# Patient Record
Sex: Male | Born: 1990 | Hispanic: No | Marital: Single | Smoking: Current every day smoker
Health system: Southern US, Community
[De-identification: ages and names within clinical notes are randomized; demographics above are authoritative.]

## PROBLEM LIST (undated history)

## (undated) DIAGNOSIS — F32A Depression, unspecified: Secondary | ICD-10-CM

## (undated) DIAGNOSIS — F329 Major depressive disorder, single episode, unspecified: Secondary | ICD-10-CM

## (undated) DIAGNOSIS — F319 Bipolar disorder, unspecified: Secondary | ICD-10-CM

## (undated) HISTORY — PX: HX APPENDECTOMY: SHX54

## (undated) HISTORY — PX: APPENDECTOMY: SHX54

---
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
## 2013-10-06 ENCOUNTER — Encounter (HOSPITAL_BASED_OUTPATIENT_CLINIC_OR_DEPARTMENT_OTHER): Payer: Self-pay

## 2013-10-06 ENCOUNTER — Emergency Department (HOSPITAL_BASED_OUTPATIENT_CLINIC_OR_DEPARTMENT_OTHER)
Admission: EM | Admit: 2013-10-06 | Discharge: 2013-10-06 | Disposition: A | Discharge status: Home / Self Care | Payer: MEDICAID | Attending: Emergency Medicine | Admitting: Emergency Medicine

## 2013-10-06 ENCOUNTER — Emergency Department (HOSPITAL_BASED_OUTPATIENT_CLINIC_OR_DEPARTMENT_OTHER): Payer: MEDICAID

## 2013-10-06 DIAGNOSIS — K529 Noninfective gastroenteritis and colitis, unspecified: Secondary | ICD-10-CM | POA: Insufficient documentation

## 2013-10-06 DIAGNOSIS — F17200 Nicotine dependence, unspecified, uncomplicated: Secondary | ICD-10-CM | POA: Insufficient documentation

## 2013-10-06 DIAGNOSIS — R10814 Left lower quadrant abdominal tenderness: Secondary | ICD-10-CM | POA: Insufficient documentation

## 2013-10-06 DIAGNOSIS — R1110 Vomiting, unspecified: Secondary | ICD-10-CM | POA: Insufficient documentation

## 2013-10-06 DIAGNOSIS — K560 Paralytic ileus: Secondary | ICD-10-CM | POA: Insufficient documentation

## 2013-10-06 DIAGNOSIS — K5900 Constipation, unspecified: Secondary | ICD-10-CM | POA: Insufficient documentation

## 2013-10-06 DIAGNOSIS — R197 Diarrhea, unspecified: Secondary | ICD-10-CM | POA: Insufficient documentation

## 2013-10-06 LAB — COMPREHENSIVE METABOLIC PROFILE - BMC/JMC ONLY
ALBUMIN: 4.6 g/dL (ref 3.2–5.0)
ALKALINE PHOSPHATASE: 54 IU/L (ref 35–120)
ALT (SGPT): 30 IU/L (ref 0–63)
AST (SGOT): 39 IU/L (ref 0–45)
BILIRUBIN, TOTAL: 2.3 mg/dL — ABNORMAL HIGH (ref 0.0–1.3)
BUN: 14 mg/dL (ref 6–22)
CALCIUM: 9.9 mg/dL (ref 8.5–10.5)
CARBON DIOXIDE: 25 mmol/L (ref 22–32)
CHLORIDE: 98 mmol/L — ABNORMAL LOW (ref 101–111)
CREATININE: 0.95 mg/dL (ref 0.72–1.30)
ESTIMATED GLOMERULAR FILTRATION RATE: >60 mL/min (ref >60)
GLUCOSE: 110 mg/dL (ref 70–110)
POTASSIUM: 4 mmol/L (ref 3.5–5.0)
SODIUM: 132 mmol/L — ABNORMAL LOW (ref 136–145)
TOTAL PROTEIN: 7.2 g/dL (ref 6.0–8.0)

## 2013-10-06 LAB — CBC
BASOPHIL #: 0.02 K/uL (ref 0.00–0.10)
BASOPHILS %: 0.3 % (ref 0.0–1.4)
EOSINOPHIL #: 0.05 K/uL (ref 0.00–0.50)
EOSINOPHIL %: 0.7 % (ref 0.0–5.2)
HCT: 46.2 % (ref 39.0–50.0)
HGB: 16.7 g/dL (ref 13.5–18.0)
LYMPHOCYTE #: 0.96 10*3/uL (ref 0.70–3.20)
LYMPHOCYTE %: 12.9 % — ABNORMAL LOW (ref 15.0–43.0)
MCH: 32.3 pg (ref 28.0–34.0)
MCHC: 36.2 g/dL (ref 33.0–37.0)
MCV: 89.2 fL (ref 83.0–97.0)
MONOCYTE #: 0.42 10*3/uL (ref 0.20–0.90)
MONOCYTE %: 5.6 % (ref 4.8–12.0)
MPV: 8.3 fL (ref 7.0–9.4)
PLATELET COUNT: 189 10*3/uL (ref 150–400)
PMN #: 5.97 K/uL (ref 1.50–6.50)
PMN %: 80.6 % — ABNORMAL HIGH (ref 43.0–76.0)
RBC: 5.18 M/uL (ref 4.30–5.40)
RDW: 11.4 % (ref 11.0–13.0)
WBC: 7.4 K/uL (ref 4.0–11.0)

## 2013-10-06 LAB — LIPASE: LIPASE: 21 U/L (ref 0–60)

## 2013-10-06 MED ORDER — SODIUM CHLORIDE 0.9 % IV BOLUS
1000.0000 mL | INJECTION | Status: AC
Start: 2013-10-06 — End: 2013-10-06

## 2013-10-06 MED ORDER — KETOROLAC 30 MG/ML (1 ML) INJECTION SOLUTION
30.00 mg | INTRAMUSCULAR | Status: AC
Start: 2013-10-06 — End: 2013-10-06
  Administered 2013-10-06: 30 mg via INTRAVENOUS
  Filled 2013-10-06: qty 1

## 2013-10-06 MED ORDER — MAGNESIUM CITRATE ORAL SOLUTION
296.0000 mL | Freq: Once | ORAL | Status: AC
Start: 2013-10-06 — End: 2013-10-06
  Administered 2013-10-06: 296 mL via ORAL
  Filled 2013-10-06: qty 300

## 2013-10-06 MED ADMIN — sodium chloride 0.9 % intravenous solution: 0 mL | INTRAVENOUS

## 2013-10-06 MED ADMIN — sodium chloride 0.9 % intravenous solution: 1000 mL | INTRAVENOUS | NDC 00338004904

## 2013-10-06 NOTE — Discharge Instructions (Signed)
1. Miralax daily until resolved

## 2013-10-06 NOTE — ED Provider Notes (Signed)
Kara Pacer, MD  [facility]  Emergency Department Visit Note    Date:  10/06/2013  Primary care provider:  No primary provider on file.  Means of arrival:  private car  History obtained from: patient  History limited by: none    Chief Complaint:  Abdominal Pain    HISTORY OF PRESENT ILLNESS     Marco Nunez, date of birth [birth year], is a 22 y.o. male who presents to the Emergency Department complaining of abdominal pain.     Context:  Patient states that he thinks he is constipated. He reports that he has had some diarrhea.   Pertinent Past Medical History:  Patient has a history of appendectomy.   Onset:  Patient has had abdominal pain for the past week and a half.   Timing:  Constant  Location/Radiation:  Abdomen  Quality:  Unknown  Severity:  4/10  Modifying Factors:  Unknown  Associated Symptoms:   Positive:  Vomiting, constipation, and diarrhea  Negative:  Fevers, cough, sore throat, rhinorrhea, chills, sweats, rashes, hematochezia, urinary symptoms, or shortness of breath    REVIEW OF SYSTEMS     The pertinent positive and negative symptoms are as per HPI. All other systems reviewed and are negative.     PATIENT HISTORY     Past Medical History:  History reviewed. No pertinent past medical history.    Past Surgical History:  Past Surgical History   Procedure Laterality Date    Hx appendectomy         Family History:  No history of acute family illness given at this time.       Social History:  History   Substance Use Topics    Smoking status: Current Every Day Smoker -- 0.25 packs/day    Smokeless tobacco: Not on file    Alcohol Use: Yes      Comment: occ     History   Drug Use No       Medications:  Previous Medications    No medications on file       Allergies:  No Known Allergies    PHYSICAL EXAM     Vitals:  Filed Vitals:    10/06/13 0135   BP: 110/73   Pulse: 91   Temp: 37 C (98.6 F)   Resp: 16   SpO2: 100%     Pulse ox  100% on None (Room Air) interpreted by me as:  Normal    Constitutional: The patient is alert and oriented to person, place, and time. Well-developed and well-nourished.  HENT: Atraumatic, normocephalic head. Mucous membranes moist. TM's clear, Nares unremarkable. Oropharynx shows no erythema or exudate.   Eyes: Pupils equal and round, reactive to light. No scleral icterus. Normal conjunctiva. Extraocular movements are intact.  Neck: Supple, non-tender, no nuchal rigidity, no adenopathy.   Lungs: Clear to auscultation bilaterally. Symmetric and equal expansion. No respiratory distress or retractions.  Cardiovascular: Heart is S1-S2 regular rate and regular rhythm without murmur click or rub.  Abdomen:  Soft, non-distended. Diffuse abdominal tenderness, worse in left lower quadrant without evidence of rebound or guarding. No pulsatile masses. No organomegaly.   Genitourinary: No CVA tenderness.   Extremities: Full range of motion, no clubbing, cyanosis, or edema. Pulses 2+, capillary refill <2 seconds.  Spine: No midline or paraspinal muscle tenderness to palpation. No step-off.   Skin: Warm and dry. No cyanosis, jaundice, rash or lesion.  Neurologic: Alert and oriented x3. Normal facial symmetry  and speech, Normal upper and lower extremity strength, and grossly normal sensation.     DIAGNOSTIC STUDIES     Labs:    Results for orders placed during the hospital encounter of 10/06/13   CBC       Result Value Range    WBC 7.4  4.0 - 11.0 K/uL    RBC 5.18  4.30 - 5.40 M/uL    HGB 16.7  13.5 - 18.0 g/dL    HCT 16.1  09.6 - 04.5 %    MCV 89.2  83.0 - 97.0 fL    MCH 32.3  28.0 - 34.0 pg    MCHC 36.2  33.0 - 37.0 g/dL    RDW 40.9  81.1 - 91.4 %    PLATELET COUNT 189  150 - 400 K/uL    MPV 8.3  7.0 - 9.4 fL    PMN % 80.6 (*) 43.0 - 76.0 %    LYMPHOCYTE % 12.9 (*) 15.0 - 43.0 %    MONOCYTE % 5.6  4.8 - 12.0 %    EOSINOPHIL % 0.7  0.0 - 5.2 %    BASOPHILS % 0.3  0.0 - 1.4 %    PMN # 5.97  1.50 - 6.50 K/uL    LYMPHOCYTE # 0.96  0.70 - 3.20 K/uL    MONOCYTE # 0.42  0.20 -  0.90 K/uL    EOSINOPHIL # 0.05  0.00 - 0.50 K/uL    BASOPHIL # 0.02  0.00 - 0.10 K/uL   COMPREHENSIVE METABOLIC PROFILE - [facility] ONLY       Result Value Range    GLUCOSE 110  70 - 110 mg/dL    BUN 14  6 - 22 mg/dL    CREATININE 7.82  9.56 - 1.30 mg/dL    ESTIMATED GLOMERULAR FILTRATION RATE >60  >60 ml/min    SODIUM 132 (*) 136 - 145 mmol/L    POTASSIUM 4.0  3.5 - 5.0 mmol/L    CHLORIDE 98 (*) 101 - 111 mmol/L    CARBON DIOXIDE 25  22 - 32 mmol/L    CALCIUM 9.9  8.5 - 10.5 mg/dL    TOTAL PROTEIN 7.2  6.0 - 8.0 g/dL    ALBUMIN 4.6  3.2 - 5.0 g/dL    BILIRUBIN, TOTAL 2.3 (*) 0.0 - 1.3 mg/dL    AST (SGOT) 39  0 - 45 IU/L    ALT (SGPT) 30  0 - 63 IU/L    ALKALINE PHOSPHATASE 54  35 - 120 IU/L   LIPASE       Result Value Range    LIPASE 21  0 - 60 U/L     Labs reviewed and interpreted by me.    Radiology:    CT ABDOMEN PELVIS WO IV CONTRAST: Ileus likely secondary to gastroenteritis.   Radiological imaging interpreted by radiologist and independently reviewed by me.     ED PROGRESS NOTE / MEDICAL DECISION MAKING     Old records reviewed by me:  I have reviewed the nurse's notes. I have reviewed the patient's problem list. I have reviewed the patient's relevant previous records.      Orders Placed This Encounter    CT ABDOMEN PELVIS WO IV CONTRAST    CBC    COMPREHENSIVE METABOLIC PROFILE - [facility] ONLY    LIPASE    INSERT & MAINTAIN PERIPHERAL IV ACCESS    NS bolus infusion 1,000 mL    ketorolac (TORADOL) 30 mg/mL injection  magnesium citrate (CITROMA) oral liquid     Patient was initially treated with IV Toradol.  Abdomen/Pelvis CT and labs ordered.    Differentials:   Diverticulitis  Constipation  Bowel obstruction  Colic    0158: Initial evaluation is complete at this time. I discussed with the patient that I would give pain medication, scan him, check labs, and then reevaluate. Patient is agreeable with the treatment plan at this time.    0320: Patient treated with Citroma PO at this time.     0330: On  recheck, the patient is doing well. I explained the results of the diagnostic studies. I discussed the diagnosis, disposition, and follow-up plan. The patient understood and is in accordance with the treatment plan at this time. Return precautions to the Emergency Department were discussed. All of his questions have been answered to his satisfaction. The patient is in stable condition at the time of discharge.     Pre-Disposition Vitals:  Filed Vitals:    10/06/13 0135   BP: 110/73   Pulse: 91   Temp: 37 C (98.6 F)   Resp: 16   SpO2: 100%     CLINICAL IMPRESSION     Ileus  Gastroenteritis    DISPOSITION/PLAN     Discharged        Follow-Up:     Arliss Journey, MD  [address]  [address]  [place] [address]  [phone]    Call in 2 days    Condition at Disposition: Stable      SCRIBE ATTESTATION STATEMENT  I Corey Harold, SCRIBE scribed for Kara Pacer, MD on 10/06/2013 at 1:53 AM.     Documentation assistance provided for Kara Pacer, MD  by Corey Harold, SCRIBE. Information recorded by the scribe was done at my direction and has been reviewed and validated by me Kara Pacer, MD.

## 2013-10-06 NOTE — ED Nurses Note (Signed)
Patient discharged home with family.  AVS reviewed with patient/care giver.  A written copy of the AVS and discharge instructions was given to the patient/care giver.  Questions sufficiently answered as needed.  Patient/care giver encouraged to follow up with PCP as indicated.  In the event of an emergency, patient/care giver instructed to call 911 or go to the nearest emergency room.     There are no discharge medications for this patient.

## 2013-10-06 NOTE — ED Nurses Note (Signed)
Mid-epigastric abd pain x 1 1/2 weeks. "I thought I was constipated, but tonight at 1800 it got worse." Small amount of liquid stool. +vomiting this evening.

## 2014-09-04 ENCOUNTER — Encounter (HOSPITAL_BASED_OUTPATIENT_CLINIC_OR_DEPARTMENT_OTHER): Payer: Self-pay

## 2014-09-04 ENCOUNTER — Emergency Department (HOSPITAL_BASED_OUTPATIENT_CLINIC_OR_DEPARTMENT_OTHER): Payer: Worker's Comp, Other unspecified

## 2014-09-04 ENCOUNTER — Emergency Department (HOSPITAL_BASED_OUTPATIENT_CLINIC_OR_DEPARTMENT_OTHER)
Admission: EM | Admit: 2014-09-04 | Discharge: 2014-09-04 | Disposition: A | Discharge status: Home / Self Care | Payer: Worker's Comp, Other unspecified | Attending: Emergency Medicine | Admitting: Emergency Medicine

## 2014-09-04 DIAGNOSIS — Y990 Civilian activity done for income or pay: Secondary | ICD-10-CM | POA: Insufficient documentation

## 2014-09-04 DIAGNOSIS — S161XXA Strain of muscle, fascia and tendon at neck level, initial encounter: Secondary | ICD-10-CM | POA: Insufficient documentation

## 2014-09-04 DIAGNOSIS — S40011A Contusion of right shoulder, initial encounter: Secondary | ICD-10-CM | POA: Insufficient documentation

## 2014-09-04 DIAGNOSIS — S29012A Strain of muscle and tendon of back wall of thorax, initial encounter: Secondary | ICD-10-CM | POA: Insufficient documentation

## 2014-09-04 DIAGNOSIS — Y9289 Other specified places as the place of occurrence of the external cause: Secondary | ICD-10-CM | POA: Insufficient documentation

## 2014-09-04 DIAGNOSIS — F17210 Nicotine dependence, cigarettes, uncomplicated: Secondary | ICD-10-CM | POA: Insufficient documentation

## 2014-09-04 DIAGNOSIS — W228XXA Striking against or struck by other objects, initial encounter: Secondary | ICD-10-CM | POA: Insufficient documentation

## 2014-09-04 MED ORDER — NAPROXEN 375 MG TABLET
375.00 mg | ORAL_TABLET | Freq: Two times a day (BID) | ORAL | Status: AC
Start: 2014-09-04 — End: ?

## 2014-09-04 NOTE — ED Provider Notes (Signed)
Marco Nunez Marco Nunez, P.A.  [facility]  Emergency Department Visit Note    Date: 09/04/2014  Primary care provider: None Given  Means of arrival: private car  History obtained by: patient  History limited by: none  ED Attending: Dr Vonda AntiguaGentle      Chief Complaint: injury to neck & back while @ work    History of Present Illness     Marco Nunez, date of birth 1991/07/27, is a 23 y.o. male who presents to the Emergency Department complaining of trauma to neck & back while @ work today.   The patient presents here via private automobile after a work-related injury.  The patient states he was in his place of employment and unloading a tractor trailer when a stack of multiple boxes fell on him.  The patient states he was leaning over at the time and he estimates somewhere between 12 and 15 boxes fell, striking him over the upper back and neck.  He estimates the weight of the individual boxes as being between 2 and 3 pounds.  He suffered no loss of consciousness.  The patient states he continued to work, but experienced some stiffness along the right side of his neck, as well as along the upper shoulder blade on the left.  The patient states he did not try any medications for this, but because of the persistent nature of his symptoms, requested to leave work for evaluation.  As already stated, the patient drove himself here.    REVIEW OF SYSTEMS:  No loss of consciousness, no visual changes, no ear drainage, no nasal discharge, no difficulty swallowing or breathing.  No chest pain, no shortness of breath, no extremity weakness.  All other remaining review of systems have been discussed and are negative.        Context:  As Per HPI  Pertinent Past Medical History:  Reviewed  Onset:  As per HPI  Timing:  As per HPI  Location/Radiation:neck    Quality:  As per HPI  Severity:  5  Modifying Factors:  As per HPI  Associated Symptoms:   Positive:  As per HPI  Negative:  As per HPI    Review of Systems     The pertinent positive  and negative symptoms are as per HPI. All other systems reviewed and are negative.    Patient History      Past Medical History:  History reviewed. No pertinent past medical history.        Past Surgical History:  Past Surgical History   Procedure Laterality Date   . Hx appendectomy             Family History:  No family history on file.        Social History:  History   Substance Use Topics   . Smoking status: Current Every Day Smoker -- 0.25 packs/day   . Smokeless tobacco: Not on file   . Alcohol Use: Yes      Comment: occ     History   Drug Use No       Medications:  Previous Medications    No medications on file       Allergies:   No Known Allergies    Physical Exam   PHYSICAL EXAMINATION:  This is a well-nourished, well-developed young male patient.  He is sitting upright on the stretcher using his cell phone with no signs of acute distress.    NEUROLOGICAL:  Speech is clear and coherent.  Cranial nerves II-VII are grossly intact.  Deep tendon reflexes are symmetrical in the upper and lower extremities.  He can do finger-to-nose and past pointing without difficulty.  He can do rapid alternating movements without difficulty.  He can abduct and flex the shoulders to 90 degrees against resistance with no focal weakness.  Grips were symmetrical, 5/5.  He can perform tandem walk, including walking on his heels and toes with no deficits.  Romberg was negative.  He had no focal motor or sensory deficits on his neuro exam.    HEENT:  His head is normocephalic and atraumatic.  Eyes:  Conjunctivae pink.  Sclerae anicteric.  Pupils are round, equal, react to light.  Extraocular movements are intact.  There was no nystagmus.  Ears:  No hemotympanum or Battle sign.  Nares:  No epistaxis.  Oral cavity:  Dentition was in good condition.  Tongue is on midline.  Posterior pharynx without injection or asymmetry.    NECK:  Complains of tenderness paraspinously on the right side of his neck.  No crepitus, or step-off over the  cervical spine and no tenderness over the spinous processes from the thoracic spine to the sacrum.  Posteriorly, there was no evidence of any asymmetry or respiratory excursion.    HEART:  Regular rate and rhythm without tachycardia or murmur.  Peripheral pulses were symmetrical and +2 throughout.    LUNGS:  Clear to auscultation.    ABDOMEN:  Flat, nontender.    EXTREMITIES:  No dependent edema, no focal weakness.    INTEGUMENTARY SYSTEM:  No evidence of any ecchymosis, erythema (no open skin lesions).    Because of the patient's concerns regarding a work-related injury, a plain cervical spine series has been ordered.        Vital Signs:  Filed Vitals:    09/04/14 0859   BP: 120/62   Pulse: 94   Temp: 36.3 C (97.4 F)   Resp: 16   SpO2: 100%              Diagnostics     Labs:  No results found for this or any previous visit (from the past 12 hour(s)).  Labs reviewed and interpreted by me.    Radiology: The patient's X RAYS were negative.     The patient is being prescribed anti-inflammatory and muscle relaxer.  The patient is being excused from work for 3 days.  The patient is being referred to the medical doctor on-call for outpatient followup, Dr. Laurell Kane, or the patient can followup with urgent care.  The patient was given a hand-out on muscle on contusions and strains.  The patient's condition upon discharge is considered unchanged, but good.        XR CERVICAL SPINE SERIES (4 OR MORE VIEWS)  Interpreted by radiologist and independently reviewed by me.    Warnell Bureau on Accession: [id number] MRN: [id number] OrderingMD: Kalani Baray J      >>>>>> Prelim: No acute fracture.      No_Acute_Findings     >>>>>> States:  By vjenning @10 /30/2015 9:55:29 AM: Needs Prelim   By hjung @10 /30/2015 10:06:08 AM: Prelim from Rad - Negative   By dwert @10 /30/2015 10:10:05 AM: Acknowledged by ED     >>>>>> Notes:  By vjenning @10 /30/2015 9:55:38 AM: work related injury- multiple boxes fell on neck, right neck pain      >>>>>> Final Communication:            Old records reviewed by me:  Orders Placed This Encounter   . XR CERVICAL SPINE SERIES (4 OR MORE VIEWS)   . naproxen (NAPROSYN) 375 mg Oral Tablet        Pre-hypertension/ Hypertension: The patient has been informed that they may have pre-hypertension or Hypertension based on a blood pressure reading in the emergency department.  I recommend that the patient call the primary care provider listed on their discharge instructions or a physician of their choice this week to arrange follow up for further evaluation of possible pre-hypertension or Hypertension.    Pre-Disposition Vitals:  Filed Vitals:    09/04/14 0859   BP: 120/62   Pulse: 94   Temp: 36.3 C (97.4 F)   Resp: 16   SpO2: 100%       Clinical Impression      1.Acute Contusion to Neck;Right Trapezius   2.Acute Left Rhomboid Strain    Plan/Disposition     Discharged    Prescriptions:  New Prescriptions    NAPROXEN (NAPROSYN) 375 MG ORAL TABLET    Take 1 Tab (375 mg total) by mouth Twice daily with food       Follow Up:  Burna CashBurke, Lola, MD  [address]  [place] [id number]  [phone]    Schedule an appointment as soon as possible for a visit in 2 days  As needed        Condition on Disposition: stable

## 2014-09-04 NOTE — ED Nurses Note (Signed)
States boxes fell on him this am at work, has neck and back pain.

## 2016-01-12 ENCOUNTER — Emergency Department
Admission: EM | Admit: 2016-01-12 | Discharge: 2016-01-12 | Disposition: A | Discharge status: Home / Self Care | Payer: Self-pay | Attending: Student | Admitting: Student

## 2016-01-12 DIAGNOSIS — Z23 Encounter for immunization: Secondary | ICD-10-CM | POA: Insufficient documentation

## 2016-01-12 DIAGNOSIS — Y9289 Other specified places as the place of occurrence of the external cause: Secondary | ICD-10-CM | POA: Insufficient documentation

## 2016-01-12 DIAGNOSIS — S61213A Laceration without foreign body of left middle finger without damage to nail, initial encounter: Secondary | ICD-10-CM | POA: Insufficient documentation

## 2016-01-12 DIAGNOSIS — F17210 Nicotine dependence, cigarettes, uncomplicated: Secondary | ICD-10-CM | POA: Insufficient documentation

## 2016-01-12 DIAGNOSIS — Y9389 Activity, other specified: Secondary | ICD-10-CM | POA: Insufficient documentation

## 2016-01-12 DIAGNOSIS — S61219A Laceration without foreign body of unspecified finger without damage to nail, initial encounter: Secondary | ICD-10-CM

## 2016-01-12 DIAGNOSIS — Y288XXA Contact with other sharp object, undetermined intent, initial encounter: Secondary | ICD-10-CM | POA: Insufficient documentation

## 2016-01-12 DIAGNOSIS — Y998 Other external cause status: Secondary | ICD-10-CM | POA: Insufficient documentation

## 2016-01-12 MED ORDER — TETANUS-DIPHTH-ACELL PERTUSSIS 5-2.5-18.5 LF-MCG/0.5 IM SUSP
0.5000 mL | Freq: Once | INTRAMUSCULAR | Status: AC
  Administered 2016-01-12: 0.5 mL via INTRAMUSCULAR
  Filled 2016-01-12: qty 0.5

## 2016-01-12 MED ORDER — SULFAMETHOXAZOLE-TRIMETHOPRIM 800-160 MG PO TABS: 1.0000 | ORAL_TABLET | Freq: Two times a day (BID) | ORAL | Status: DC

## 2016-01-12 NOTE — Discharge Instructions (Signed)
Wear splint take medication as directed.

## 2016-01-12 NOTE — ED Notes (Signed)
Pt states he cut his left middle finger on the seat lever in his car last night

## 2016-01-12 NOTE — ED Notes (Signed)
See triage   Small laceration noted to left 3 rd digit   Bleeding controlled  States this happened about 7 pm las t night

## 2016-01-12 NOTE — ED Provider Notes (Signed)
[facility] Emergency Department Provider Note  ____________________________________________  Time seen: Approximately 1:53 PM  I have reviewed the triage vital signs and the nursing notes.   HISTORY  Chief Complaint Laceration    HPI Brent Peterson is a 25 y.o. male laceration third digit left hand secondary to a metal cut. Incident occurred approximately 1900 hrs. last night. Patient said bleeding controlled with pressure. Patient last tetanus shot unknown. Patient denies any loss sensation or loss of function of the finger.Patient denies pain at this time.   History reviewed. No pertinent past medical history.  There are no active problems to display for this patient.   Past Surgical History  Procedure Laterality Date  . Appendectomy      Current Outpatient Rx  Name  Route  Sig  Dispense  Refill  . sulfamethoxazole-trimethoprim (BACTRIM DS,SEPTRA DS) 800-160 MG tablet   Oral   Take 1 tablet by mouth 2 (two) times daily.   20 tablet   0     Allergies Review of patient's allergies indicates no known allergies.  No family history on file.  Social History Social History  Substance Use Topics  . Smoking status: Current Every Day Smoker    Types: Cigarettes  . Smokeless tobacco: None  . Alcohol Use: Yes    Review of Systems Constitutional: No fever/chills Eyes: No visual changes. ENT: No sore throat. Cardiovascular: Denies chest pain. Respiratory: Denies shortness of breath. Gastrointestinal: No abdominal pain.  No nausea, no vomiting.  No diarrhea.  No constipation. Genitourinary: Negative for dysuria. Musculoskeletal: Negative for back pain. Skin: Negative for rash. Laceration third finger left hand. Neurological: Negative for headaches, focal weakness or numbness.    ____________________________________________   PHYSICAL EXAM:  VITAL SIGNS: ED Triage Vitals  Enc Vitals Group     BP 01/12/16 1330 111/68 mmHg   Pulse Rate 01/12/16 1330 77     Resp 01/12/16 1330 16     Temp 01/12/16 1330 97.8 F (36.6 C)     Temp Source 01/12/16 1330 Oral     SpO2 01/12/16 1330 100 %     Weight 01/12/16 1330 150 lb (68.04 kg)     Height 01/12/16 1330  (1.702 m)     Head Cir --      Peak Flow --      Pain Score --      Pain Loc --      Pain Edu? --      Excl. in GC? --     Constitutional: Alert and oriented. Well appearing and in no acute distress. Eyes: Conjunctivae are normal. PERRL. EOMI. Head: Atraumatic. Nose: No congestion/rhinnorhea. Mouth/Throat: Mucous membranes are moist.  Oropharynx non-erythematous. Neck: No stridor.  No cervical spine tenderness to palpation. Hematological/Lymphatic/Immunilogical: No cervical lymphadenopathy. Cardiovascular: Normal rate, regular rhythm. Grossly normal heart sounds.  Good peripheral circulation. Respiratory: Normal respiratory effort.  No retractions. Lungs CTAB. Gastrointestinal: Soft and nontender. No distention. No abdominal bruits. No CVA tenderness. Musculoskeletal: No lower extremity tenderness nor edema.  No joint effusions. Neurologic:  Normal speech and language. No gross focal neurologic deficits are appreciated. No gait instability. Skin:  Skin is warm, dry and intact. No rash noted. 0.5 cm palmar aspect of the proximal phalange third digit left hand. Psychiatric: Mood and affect are normal. Speech and behavior are normal.  ____________________________________________   LABS (all labs ordered are listed, but only abnormal results are displayed)  Labs Reviewed - No data to display ____________________________________________  EKG   ____________________________________________  RADIOLOGY   ____________________________________________   PROCEDURES  Procedure(s) performed: None  Critical Care performed: No  ____________________________________________   INITIAL IMPRESSION / ASSESSMENT AND PLAN / ED COURSE  Pertinent labs &  imaging results that were available during my care of the patient were reviewed by me and considered in my medical decision making (see chart for details). Laceration third digit left hand. Discussed rationale for not suturing this time secondary due to time between injury. Patient given a tetanus shot in the ED. She had prescription for Bactrim. Laceration with clean and sterile strips applied. Patient was placed in a finger splint. ____________________________________________   FINAL CLINICAL IMPRESSION(S) / ED DIAGNOSES  Final diagnoses:  Finger laceration, initial encounter      Joni ReiningRonald K Chronister, PA-C 01/12/16 1407  Gayla DossEryka A Gayle, MD 01/12/16 1547

## 2016-07-21 ENCOUNTER — Encounter: Payer: Self-pay | Admitting: Emergency Medicine

## 2016-07-21 ENCOUNTER — Emergency Department
Admission: EM | Admit: 2016-07-21 | Discharge: 2016-07-22 | Disposition: A | Discharge status: Other Acute Inpatient Hospital | Payer: Self-pay | Attending: Emergency Medicine | Admitting: Emergency Medicine

## 2016-07-21 DIAGNOSIS — F32A Depression, unspecified: Secondary | ICD-10-CM

## 2016-07-21 DIAGNOSIS — T1491XA Suicide attempt, initial encounter: Secondary | ICD-10-CM

## 2016-07-21 DIAGNOSIS — Z5181 Encounter for therapeutic drug level monitoring: Secondary | ICD-10-CM | POA: Insufficient documentation

## 2016-07-21 DIAGNOSIS — F17210 Nicotine dependence, cigarettes, uncomplicated: Secondary | ICD-10-CM | POA: Insufficient documentation

## 2016-07-21 DIAGNOSIS — F329 Major depressive disorder, single episode, unspecified: Secondary | ICD-10-CM | POA: Insufficient documentation

## 2016-07-21 LAB — URINE DRUG SCREEN, QUALITATIVE (ARMC ONLY)
Amphetamines, Ur Screen: NOT DETECTED
BARBITURATES, UR SCREEN: NOT DETECTED
Benzodiazepine, Ur Scrn: NOT DETECTED
COCAINE METABOLITE, UR ~~LOC~~: NOT DETECTED
Cannabinoid 50 Ng, Ur ~~LOC~~: NOT DETECTED
MDMA (ECSTASY) UR SCREEN: NOT DETECTED
Methadone Scn, Ur: NOT DETECTED
OPIATE, UR SCREEN: NOT DETECTED
Phencyclidine (PCP) Ur S: NOT DETECTED
Tricyclic, Ur Screen: NOT DETECTED

## 2016-07-21 LAB — CBC
HEMATOCRIT: 47.1 % (ref 40.0–52.0)
HEMOGLOBIN: 16.6 g/dL (ref 13.0–18.0)
MCH: 31.1 pg (ref 26.0–34.0)
MCHC: 35.3 g/dL (ref 32.0–36.0)
MCV: 88 fL (ref 80.0–100.0)
Platelets: 198 10*3/uL (ref 150–440)
RBC: 5.35 MIL/uL (ref 4.40–5.90)
RDW: 13.2 % (ref 11.5–14.5)
WBC: 8.5 10*3/uL (ref 3.8–10.6)

## 2016-07-21 LAB — COMPREHENSIVE METABOLIC PANEL
ALBUMIN: 5.2 g/dL — AB (ref 3.5–5.0)
ALK PHOS: 46 U/L (ref 38–126)
ALT: 23 U/L (ref 17–63)
AST: 23 U/L (ref 15–41)
Anion gap: 9 (ref 5–15)
BUN: 12 mg/dL (ref 6–20)
CALCIUM: 9.8 mg/dL (ref 8.9–10.3)
CO2: 27 mmol/L (ref 22–32)
CREATININE: 0.96 mg/dL (ref 0.61–1.24)
Chloride: 104 mmol/L (ref 101–111)
GFR calc non Af Amer: >60 mL/min (ref >60)
GLUCOSE: 103 mg/dL — AB (ref 65–99)
Potassium: 4 mmol/L (ref 3.5–5.1)
SODIUM: 140 mmol/L (ref 135–145)
Total Bilirubin: 2.5 mg/dL — ABNORMAL HIGH (ref 0.3–1.2)
Total Protein: 8.6 g/dL — ABNORMAL HIGH (ref 6.5–8.1)

## 2016-07-21 LAB — ETHANOL: Alcohol, Ethyl (B): <5 mg/dL (ref <5)

## 2016-07-21 LAB — ACETAMINOPHEN LEVEL: Acetaminophen (Tylenol), Serum: <10 ug/mL — ABNORMAL LOW (ref 10–30)

## 2016-07-21 LAB — SALICYLATE LEVEL: Salicylate Lvl: <4 mg/dL (ref 2.8–30.0)

## 2016-07-21 MED ORDER — BUPROPION HCL 75 MG PO TABS
75.0000 mg | ORAL_TABLET | Freq: Every day | ORAL | Status: DC
  Administered 2016-07-22: 75 mg via ORAL
  Filled 2016-07-21: qty 1

## 2016-07-21 MED ORDER — HALOPERIDOL LACTATE 5 MG/ML IJ SOLN
INTRAMUSCULAR | Status: AC
  Filled 2016-07-21: qty 1

## 2016-07-21 MED ORDER — LORAZEPAM 2 MG/ML IJ SOLN
INTRAMUSCULAR | Status: AC
  Filled 2016-07-21: qty 1

## 2016-07-21 NOTE — ED Notes (Signed)
Pt brought into ED BHU via sally port and wand with metal detector for safety by [employer] officer. Patient oriented to unit/care area: Pt informed of unit policies and procedures.  Informed that, for their safety, care areas are designed for safety and monitored by security cameras at all times; and visiting hours explained to patient. Patient verbalizes understanding, and verbal contract for safety obtained.Pt shown to their room.   BEHAVIORAL HEALTH ROUNDING  Patient sleeping: No.  Patient alert and oriented: yes  Behavior appropriate: Yes. ; If no, describe:  Nutrition and fluids offered: Yes  Toileting and hygiene offered: Yes  Sitter present: not applicable, Q 15 min safety rounds and observation via security camera. Law enforcement present: Yes [employer]   ENVIRONMENTAL ASSESSMENT  Potentially harmful objects out of patient reach: Yes.  Personal belongings secured: Yes.  Patient dressed in hospital provided attire only: Yes.  Plastic bags out of patient reach: Yes.  Patient care equipment (cords, cables, call bells, lines, and drains) shortened, removed, or accounted for: Yes.  Equipment and supplies removed from bottom of stretcher: Yes.  Potentially toxic materials out of patient reach: Yes.  Sharps container removed or out of patient reach: Yes.    
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
## 2016-07-22 ENCOUNTER — Encounter: Payer: Self-pay | Admitting: *Deleted

## 2016-07-22 ENCOUNTER — Inpatient Hospital Stay
Admission: RE | Admit: 2016-07-22 | Discharge: 2016-07-26 | DRG: 885 | Disposition: A | Discharge status: Home / Self Care | Payer: No Typology Code available for payment source | Source: Intra-hospital | Attending: Psychiatry | Admitting: Psychiatry

## 2016-07-22 DIAGNOSIS — F322 Major depressive disorder, single episode, severe without psychotic features: Principal | ICD-10-CM | POA: Diagnosis present

## 2016-07-22 DIAGNOSIS — G4700 Insomnia, unspecified: Secondary | ICD-10-CM | POA: Diagnosis present

## 2016-07-22 DIAGNOSIS — R45851 Suicidal ideations: Secondary | ICD-10-CM | POA: Diagnosis present

## 2016-07-22 DIAGNOSIS — Z9049 Acquired absence of other specified parts of digestive tract: Secondary | ICD-10-CM | POA: Diagnosis not present

## 2016-07-22 DIAGNOSIS — F17210 Nicotine dependence, cigarettes, uncomplicated: Secondary | ICD-10-CM | POA: Diagnosis present

## 2016-07-22 DIAGNOSIS — T1491XA Suicide attempt, initial encounter: Secondary | ICD-10-CM

## 2016-07-22 MED ORDER — TRAZODONE HCL 100 MG PO TABS: 100.0000 mg | ORAL_TABLET | Freq: Every evening | ORAL | Status: DC | PRN

## 2016-07-22 MED ORDER — MAGNESIUM HYDROXIDE 400 MG/5ML PO SUSP: 30.0000 mL | Freq: Every day | ORAL | Status: DC | PRN

## 2016-07-22 MED ORDER — CITALOPRAM HYDROBROMIDE 20 MG PO TABS: 20.0000 mg | ORAL_TABLET | Freq: Every day | ORAL | Status: DC

## 2016-07-22 MED ORDER — ALUM & MAG HYDROXIDE-SIMETH 200-200-20 MG/5ML PO SUSP: 30.0000 mL | ORAL | Status: DC | PRN

## 2016-07-22 MED ORDER — CITALOPRAM HYDROBROMIDE 20 MG PO TABS
20.0000 mg | ORAL_TABLET | Freq: Every day | ORAL | Status: DC
  Administered 2016-07-23 – 2016-07-26 (×4): 20 mg via ORAL
  Filled 2016-07-22 (×4): qty 1

## 2016-07-22 MED ORDER — ACETAMINOPHEN 325 MG PO TABS: 650.0000 mg | ORAL_TABLET | Freq: Four times a day (QID) | ORAL | Status: DC | PRN

## 2016-07-22 NOTE — Tx Team (Signed)
Initial Treatment Plan 07/22/2016 6:49 PM Brent Peterson [id number]    PATIENT STRESSORS: Marital or family conflict   PATIENT STRENGTHS: Active sense of humor Average or above average intelligence Capable of independent living Motivation for treatment/growth   PATIENT IDENTIFIED PROBLEMS: "Learn effective coping skill"  "Make depression go away"                   DISCHARGE CRITERIA:  Improved stabilization in mood, thinking, and/or behavior  PRELIMINARY DISCHARGE PLAN: Return to previous living arrangement Return to previous work or school arrangements  PATIENT/FAMILY INVOLVEMENT: This treatment plan has been presented to and reviewed with the patient, Brent Peterson, and/or family member, .  The patient and family have been given the opportunity to ask questions and make suggestions.  [name], [name] B, RN 07/22/2016, 6:49 PM

## 2016-07-22 NOTE — ED Provider Notes (Signed)
-----------------------------------------   12:43 PM on 07/22/2016 -----------------------------------------  The patient has been seen and evaluated by psychiatry. The plan is to admit to the psychiatric service once a bed becomes available.   Minna AntisKevin Ajanay Farve, MD 07/22/16 1243

## 2016-07-22 NOTE — ED Provider Notes (Signed)
-----------------------------------------   6:30 AM on 07/22/2016 -----------------------------------------   Blood pressure 111/62, pulse 72, temperature 98 F (36.7 C), temperature source Oral, resp. rate 18, height 5' 7.5" (1.715 m), weight 147 lb (66.7 kg), SpO2 98 %.  The patient had no acute events since last update.  Calm and cooperative at this time.  Disposition is pending Psychiatry/Behavioral Medicine team recommendations.     Jennye MoccasinBrian S Quigley, MD 07/22/16 [phone]

## 2016-07-22 NOTE — ED Notes (Signed)
Was able to speak with gf and mood improved

## 2016-07-22 NOTE — ED Notes (Signed)
BEHAVIORAL HEALTH ROUNDING Patient sleeping: Yes.   Patient alert and oriented: not applicable SLEEPING Behavior appropriate: Yes.  ; If no, describe: SLEEPING Nutrition and fluids offered: No SLEEPING Toileting and hygiene offered: NoSLEEPING Sitter present: not applicable, Q 15 min safety rounds and observation via security camera. Law enforcement present: Yes ODS 

## 2016-07-22 NOTE — BH Assessment (Signed)
[facility] by Dr. Toni Amendlapacs.  Attending Physician will be Dr. Ardyth HarpsHernandez.   Patient [facility] room [facility] KenmorePhyllis.   Intake Paper Work has been signed and placed on patient chart.  ER staff is aware of the admission French Ana(Tracy, ER Sect.; Dr. Jeri LagerPaducowski, ER MD; Windell Mouldinguth, Patient's Nurse & Angelique Blonderenise, Patient Access).

## 2016-07-22 NOTE — ED Notes (Signed)
Pt taken by wc to beh med without problems

## 2016-07-22 NOTE — ED Notes (Signed)
Dr Clapacs here 
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
## 2016-07-23 DIAGNOSIS — F322 Major depressive disorder, single episode, severe without psychotic features: Principal | ICD-10-CM

## 2016-07-23 LAB — TSH: TSH: 1.673 u[IU]/mL (ref 0.350–4.500)

## 2016-07-23 NOTE — Plan of Care (Signed)
Problem: Coping: Goal: Ability to cope will improve Outcome: Progressing Pt reports that he has started reading the bible as a way to cope with depression.  Problem: Safety: Goal: Periods of time without injury will increase Outcome: Progressing Pt remains free from harm.
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
## 2016-07-23 NOTE — [facility] Group Notes (Signed)
[facility] LCSW Group Therapy  07/23/2016 2:29 PM  Type of Therapy:  Group Therapy  Participation Level:  Active  Participation Quality:  Appropriate and Attentive  Affect:  Appropriate  Cognitive:  Alert  Insight:  Engaged  Engagement in Therapy:  Engaged  Modes of Intervention:  Discussion, Education and Support  Summary of Progress/Problems:Self esteem: Patients discussed self esteem and how it impacts them. They discussed what aspects in their lives has influenced their self esteem. They were challenged to identify changes that are needed in order to improve self esteem. Patients participated in activity where they had to identify positive adjectives they felt described their personality. Patients shared with the group on the following areas: Things I am good at, What I like about my appearance, I've helped others by, What I value the most, compliments I have received, challenges I have overcome, thing that make me unique, and Times I've made others happy. Patient states he wants to reconnect with his faith and spirituality and is going to seek counsel from his church pastor as well as follow-up with aftercare plan.    Moyinoluwa Dawe G. Garnette CzechSampson MSW, LCSWA 07/23/2016, 2:30 PM
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
## 2016-07-23 NOTE — Plan of Care (Signed)
Problem: Self-Concept: Goal: Ability to disclose and discuss suicidal ideas will improve Outcome: Progressing Pt talked about suicide attempt prior to admission, and indicated that he did not feel suicidal anymore.
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
## 2016-07-23 NOTE — [facility] Suicide Risk Assessment (Signed)
[facility] INPATIENT:  Family/Significant Other Suicide Prevention Education  Suicide Prevention Education:  Patient Refusal for Family/Significant Other Suicide Prevention Education: The patient Brent PrudeDaymond R Katrinka BlazingSmith has refused to provide written consent for family/significant other to be provided Family/Significant Other Suicide Prevention Education during admission and/or prior to discharge.  Physician notified.  Jodelle Fausto G. Garnette CzechSampson MSW, LCSWA 07/23/2016, 10:55 AM

## 2016-07-23 NOTE — Progress Notes (Signed)
Pt has been pleasant and cooperative. Pt's mood and affect has been depressed. Pt denies SI and A/V hallucinations.Pt has been active on the unit. 
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
## 2016-07-24 NOTE — [facility] Group Notes (Signed)
[facility] Group Notes:  (Nursing/MHT/Case Management/Adjunct)  Date:  07/24/2016  Time:  4:56 PM  Type of Therapy:  Psychoeducational Skills  Participation Level:  Active  Participation Quality:  Appropriate and Intrusive  Affect:  Appropriate  Cognitive:  Appropriate and Oriented  Insight:  Appropriate  Engagement in Group:  Engaged  Modes of Intervention:  Discussion and Education  Summary of Progress/Problems:  [name] 07/24/2016, 4:56 PM

## 2016-07-24 NOTE — Progress Notes (Signed)
Patient with bright affect, cooperative behavior with meals, meds and plan of care. No SI/HI/AVH at this time. Verbalizes needs appropriately with staff. Completes daily audit sheet with goal of getting close with God. Therapy groups encouraged. Safety maintained.

## 2016-07-24 NOTE — Progress Notes (Signed)
D: Pt is pleasant and cooperative this evening. He is seen in the milieu interacting appropriately with staff and peers. Denies SI/HI/AVH at this time. Pt reports "I get sad when I think about my kid, but I've been reading the bible and it's really helped." A: Emotional support and encouragement provided. Medications administered with education. q15 minute safety checks maintained. R: Pt remains free from harm. Will continue to monitor.

## 2016-07-24 NOTE — [facility] Group Notes (Signed)
[facility] LCSW Group Therapy   07/24/2016 9:30am Type of Therapy: Group Therapy   Participation Level: Active   Participation Quality: Attentive, Sharing and Supportive   Affect: Appropriate   Cognitive: Alert and Oriented   Insight: Developing/Improving and Engaged   Engagement in Therapy: Developing/Improving and Engaged   Modes of Intervention: Clarification, Confrontation, Discussion, Education, Exploration,  Limit-setting, Orientation, Problem-solving, Rapport Building, Dance movement psychotherapisteality Testing, Socialization and Support   Summary of Progress/Problems: Pt identified obstacles faced currently and processed barriers involved in overcoming these obstacles. Pt identified steps necessary for overcoming these obstacles and explored motivation (internal and external) for facing these difficulties head on. Pt further identified one area of concern in their lives and chose a goal to focus on for today. Patient defined obstacle as a "test." He stated that his depressive symptoms become overbearing along with loneliness which are obstacles that he face daily. He identified reading the bible, prayer, and positive affirmations as tools to implement in his love to overcome obstacles.  Brent AbbotKadijah Digna Peterson, MSW, Brent MajorsLCSWA

## 2016-07-24 NOTE — [facility] Group Notes (Signed)
[facility] Group Notes:  (Nursing/MHT/Case Management/Adjunct)  Date:  07/24/2016  Time:  12:48 AM  Type of Therapy:  Psychoeducational Skills  Participation Level:  Active  Participation Quality:  Supportive  Affect:  Appropriate  Cognitive:  Alert  Insight:  Good  Engagement in Group:  Engaged  Modes of Intervention:  Support  Summary of Progress/Problems:  Brent NeerJackie L Mishel Sans 07/24/2016, 12:48 AM

## 2016-07-24 NOTE — Progress Notes (Signed)
Recreation Therapy Notes  Date: 09.18.17 Time: 1:00 pm Location: Craft Room  Group Topic: Self-expression  Goal Area(s) Addresses:  Patient will identify one color per emotion listed on wheel. Patient will verbalize benefit of using art as a means of self-expression. Patient will verbalize one emotion experienced during session. Patient will be educated on other forms of self-expression.  Behavioral Response: Attentive, Interactive  Intervention: Emotion Wheel  Activity: Patients were given an Arboriculturistmotion Wheel worksheet and instructed to pick a color for each emotion listed on the wheel.  Education: LRT educated patients on other forms of self-expression.  Education Outcome: Acknowledges education/In group clarification offered  Clinical Observations/Feedback: Patient completed activity by picking colors for each emotion. Patient contributed to group discussion by stating some colors he picked for certain emotions and why, that it was helpful to see his emotions on paper and why, and what makes art a good form of self-expression.  Jacquelynn CreeGreene,Bren Borys M, LRT/CTRS 07/24/2016 2:34 PM

## 2016-07-24 NOTE — Progress Notes (Signed)
Recreation Therapy Notes  INPATIENT RECREATION THERAPY ASSESSMENT  Patient Details Name: Brent EllisDaymond R Syring MRN: [id number] DOB: September 30, 1991 Today's Date: 07/24/2016  Patient Stressors: Relationship (Break-up with girlfriend, but they are back together now)  Coping Skills:   Exercise, Music, Sports, Other (Comment) (Praying, reading the Bible, writing)  Personal Challenges: Communication, Expressing Yourself, Time Management, Trusting Others  Leisure Interests (2+):  Games - Video games, Music - Listen  Awareness of Community Resources:  Yes  Community Resources:  Research scientist (physical sciences)Movie Theaters, [employer]  Current Use: Yes  If no, Barriers?:    Patient Strengths:  Psychologist, counsellingAmbitious, a Occupational hygienistleader  Patient Identified Areas of Improvement:  Learning how to open himself up enough to talk to his girlfriend and being less selfish  Current Recreation Participation:  Play video games, watch TV, play with son and dog, work  Patient Goal for Hospitalization:  To become a better man  [place]idence[place]  County of Residence:  [place]   Current SI (including self-harm):  No  Current HI:  No  Consent to Intern Participation: N/A   Jacquelynn CreeGreene,Shalynn Jorstad M, LRT/CTRS 07/24/2016, 3:01 PM

## 2016-07-25 MED ORDER — CITALOPRAM HYDROBROMIDE 20 MG PO TABS: 20.0000 mg | ORAL_TABLET | Freq: Every day | ORAL | 0 refills | Status: DC

## 2016-07-25 NOTE — Plan of Care (Signed)
Problem: Education: Goal: Emotional status will improve Outcome: Progressing Pt affect is bright and appropriate. Denies SI at this time.  Problem: Health Behavior/Discharge Planning: Goal: Compliance with treatment plan for underlying cause of condition will improve Outcome: Progressing Pt attending groups and taking medications as prescribed.

## 2016-07-25 NOTE — BHH Suicide Risk Assessment (Signed)
[facility] Discharge Suicide Risk Assessment   Principal Problem: Severe major depression, single episode, without psychotic features [facility] Discharge Diagnoses:  Patient Active Problem List   Diagnosis Date Noted  . 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 Suicide attempt [facility](HCC) [T14.91] 07/22/2016      Psychiatric Specialty Exam: ROS  Blood pressure 118/70, pulse 67, temperature 98.1 F (36.7 C), temperature source Oral, resp. rate 18, height 5\' 7"  (1.702 m), weight 63.5 kg (140 lb), SpO2 99 %.Body mass index is 21.93 kg/m.                                                       Mental Status Per Nursing Assessment::   On Admission:  NA  Demographic Factors:  Male, Adolescent or young adult and Unemployed  Loss Factors: Financial problems/change in socioeconomic status  Historical Factors: Impulsivity  Risk Reduction Factors:   Living with another person, especially a relative and Positive social support  Continued Clinical Symptoms:  Depression:   Impulsivity  Cognitive Features That Contribute To Risk:  None    Suicide Risk:  Minimal: No identifiable suicidal ideation.  Patients presenting with no risk factors but with morbid ruminations; may be classified as minimal risk based on the severity of the depressive symptoms  Follow-up Information    [facility]. Go in 3 day(s).   Why:  Go to [facility] for medication management and outpatient therapy. Walk-in hours for new patients are M,W,F 8a-3p. Please arrive early to ensure prompt service. Bring discharge summary, any medications, and photo ID. Contact information: [address] [place] [address] [phone]            Jimmy FootmanHernandez-Gonzalez,  Royston Bekele, MD 07/25/2016, 10:35 PM

## 2016-07-25 NOTE — Care Management (Signed)
I had the opportunity to speak with Brent Peterson on more than one occasion. He is very bright and passionate about his future and desires to find peace and happiness in his life. He stated that at first he didn't know what he was living for, he felt as if he had no meaning, but after arriving here and having a conversation with one of the Nurses who prayed for him, he felt like his eyes were open to the possibilities. He hopes one day to become a Education officer, environmentalastor and start his own church. 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 His disposition has been pretty controlled and positive since he has been in our care. However, I am elated to see that he is coming up with a plan and is excited about taking care of himself and doing what is best for him.

## 2016-07-25 NOTE — Plan of Care (Signed)
Problem: Coping: Goal: Ability to verbalize feelings will improve Outcome: Progressing Pt very open with feelings with staff. Interacts appropriately with peers/staff. Attends groups. Support and encouragement provided. Will continue to monitor.
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
## 2016-07-25 NOTE — B[facility] Group Notes (Signed)
[facility] LCSW Group Therapy   07/25/2016 9:30am  Type of Therapy: Group Therapy   Participation Level: Active   Participation Quality: Attentive, Sharing and Supportive   Affect: Appropriate  Cognitive: Alert and Oriented   Insight: Developing/Improving and Engaged   Engagement in Therapy: Developing/Improving and Engaged   Modes of Intervention: Clarification, Confrontation, Discussion, Education, Exploration,  Limit-setting, Orientation, Problem-solving, Rapport Building, Dance movement psychotherapisteality Testing, Socialization and Support  Summary of Progress/Problems: The topic for group therapy was feelings about diagnosis. Pt actively participated in group discussion on their past and current diagnosis and how they feel towards this. Pt also identified how society and family members judge them, based on their diagnosis as well as stereotypes and stigmas. Pt identified diagnosis as bipolar disorder. His feelings around his diagnoses are feelings of worthlessness and overwhelmed. Pt stated that he either feels "really good" when he is manic or worthless when he is depressed. Pt identified his faith as his coping mechanism to reduce negative feelings.     Hampton AbbotKadijah Leopoldo Mazzie, MSW, Theresia MajorsLCSWA
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
## 2016-07-25 NOTE — Progress Notes (Signed)
Recreation Therapy Notes  Date: 09.19.17 Time: 3:00 pm Location: Craft Room  Group Topic: Coping Skills  Goal Area(s) Addresses:  Patient will verbalize emotions related to recovery. Patient will write at least one healthy coping skill.  Behavioral Response: Attentive, Interactive  Intervention: Retail buyerCoping Skill Wheel  Activity: Patients were given Retail buyerCoping Skill Wheel worksheets and instructed to as a group think of 8 emotions they experience in their recovery. For each emotion, patients were to think of coping skills to overcome the emotion or coping skills to help them feel the emotion.  Education: LRT educated patients on coping skills.  Education Outcome: Acknowledges education/In group clarification offered   Clinical Observations/Feedback: Patient completed activity by writing 8 emotions towards recovery and copings skills for each emotion. Patient contributed to group discussion by stating that it was easy to think of the emotions and why, that it was difficult to think of the coping skills and why, and how he can remind himself to use the coping skills.  Jacquelynn CreeGreene,Brent Peterson, LRT/CTRS 07/25/2016 4:22 PM

## 2016-07-25 NOTE — Progress Notes (Signed)
Pt awake and alert, up on unit this morning. Appropriately interacts with peers/staff. Denies SI/HI/AVH. Very pleasant, calm, cooperative, bright. Medication compliant. Attends groups. Rates depression/anxiety/hopelessness 0/10 (low 0-10 high). Reports good sleep last night. Reports in order to meet his goal of staying positive, he will trust in God, pray, and read the bible. Noted to have a bible in his hand on exit of his room this morning.   Safety maintained with every 15 minute checks. Medication administered. Support and encouragement provided. Will continue to monitor.

## 2016-07-25 NOTE — [facility] Group Notes (Signed)
[facility] Group Notes:  (Nursing/MHT/Case Management/Adjunct)  Date:  07/25/2016  Time:  3:50 AM  Type of Therapy:  Psychoeducational Skills  Participation Level:  Did Not Attend  Participation Quality:  on the ph  Affect: Mayra NeerJackie L Quinnlan Abruzzo 07/25/2016, 3:50 AM

## 2016-07-25 NOTE — Progress Notes (Signed)
D: Pt affect is bright this evening. He interacts with staff and peers appropriately. Pt reports that this morning he was "disappointed" after learning he would not be discharged, but "I got over it. I understand that it's part of the job." He denies SI/HI/AVH at this time. A: Emotional support and encouragement provided. q15 minute safety checks maintained. R: Pt remains free from harm. Will continue to monitor.

## 2016-07-25 NOTE — Plan of Care (Signed)
Problem: [facility] Participation in Recreation Therapeutic Interventions Goal: STG-Other Recreation Therapy Goal (Specify) STG: Self-expression - Within 3 treatment sessions, patient will participate in self-expression activity in one treatment session to increase self-expression skills post d/c.  Outcome: Completed/Met Date Met: 07/25/16 Treatment Session 1; Completed 1 out of 1: At approximately 2:00 pm, LRT met with patient in craft room. Patient participated in self-expression activity. LRT educated patient on other forms of self-expression and why it is important to express his emotions. Patient said activity was helpful.  Leonette Monarch, LRT/CTRS 09.19.17 2:30 pm  Problem: [facility] Participation in Recreation Therapeutic Interventions Goal: STG-Other Recreation Therapy Goal (Specify) STG: Time Management - Within 3 treatment sessions, patient will verbalize understanding of scheduling in one treatment session to increase time management skills post d/c.  Outcome: Completed/Met Date Met: 07/25/16 Treatment Session 1; Completed 1 out of 1: At approximately 2:00 pm, LRT met with patient in craft room. LRT educated and provided patient with blank schedules. Patient verbalized understanding. LRT encouraged patient to use the schedules to help him manage his time.  Leonette Monarch, LRT/CTRS 09.19.17 2:32 pm

## 2016-07-26 NOTE — Progress Notes (Signed)
Recreation Therapy Notes  INPATIENT RECREATION TR PLAN  Patient Details Name: KYDAN SHANHOLTZER MRN: [id number] DOB: August 07, 1991 Today's Date: 07/26/2016  Rec Therapy Plan Is patient appropriate for Therapeutic Recreation?: Yes Treatment times per week: At least once a week TR Treatment/Interventions: 1:1 session, Group participation (Comment) (Appropriate participation in daily recreational therapy tx)  Discharge Criteria Pt will be discharged from therapy if:: Treatment goals are met, Discharged Treatment plan/goals/alternatives discussed and agreed upon by:: Patient/family  Discharge Summary Short term goals set: See Care Plan Short term goals met: Complete Progress toward goals comments: One-to-one attended Which groups?: Other (Comment), Coping skills (Self-expression) One-to-one attended: Self-expression, time management Reason goals not met: N/A Therapeutic equipment acquired: None Reason patient discharged from therapy: Discharge from hospital Pt/family agrees with progress & goals achieved: Yes Date patient discharged from therapy: 07/26/16   Leonette Monarch, LRT/CTRS 07/26/2016, 2:41 PM

## 2016-07-26 NOTE — Progress Notes (Signed)
Pt awake and alert, up on unit this morning. Appropriately interacts with peers/staff. Denies SI/HI/AVH. Bright affect.Calm and cooperative.  Pt belongings returned to include: belt, hat, cell phone x 2, keys, cigarettes, lighter, clothing from pt's room.  Provided 7 days supply of medication to take home.  Provided and reviewed discharge paperwork and prescriptions. Verified understanding by use of teach back method. Verbalizes understanding as well. To discharge back home with family.  Safety maintained with every 15 minute checks. Support and encouragement provided. Medications administered. Will continue to monitor.

## 2016-07-26 NOTE — Plan of Care (Signed)
Problem: Activity: Goal: Interest or engagement in activities will improve Outcome: Progressing Pt active in the milieu. He attends to his ADLs appropriately.   Problem: Safety: Goal: Periods of time without injury will increase Outcome: Progressing Pt remains free from harm.

## 2016-07-26 NOTE — [facility] Group Notes (Signed)
[facility] Group Notes:  (Nursing/MHT/Case Management/Adjunct)  Date:  07/26/2016  Time:  2:56 AM  Type of Therapy:  Group Therapy  Participation Level:  Active  Participation Quality:  Appropriate  Affect:  Appropriate  Cognitive:  Appropriate  Insight:  Appropriate  Engagement in Group:  Engaged  Modes of Intervention:  n/a  Summary of Progress/Problems:  Veva Holesshley Imani Zeeshan Korte 07/26/2016, 2:56 AM

## 2016-07-26 NOTE — Progress Notes (Signed)
D: Pt interacts with staff and peers appropriately this evening. Affect is bright. He reports that he is ready for discharge. Denies SI/HI/AVH at this time. A: Emotional support and encouragement provided. q15 minute safety checks maintained. R: Pt remains free from harm. Will continue to monitor.

## 2016-07-26 NOTE — Tx Team (Signed)
Interdisciplinary Treatment and Diagnostic Plan Update  07/26/2016 Time of Session: 11:00am Brent Peterson Brent Peterson MRN: [id number]  Principal Diagnosis: Severe major depression, single episode, without psychotic features (HCC)  Secondary Diagnoses: Principal Problem:   Severe major depression, single episode, without psychotic features (HCC)   Current Medications:  Current Facility-Administered Medications  Medication Dose Route Frequency Provider Last Rate Last Dose  . acetaminophen (TYLENOL) tablet 650 mg  650 mg Oral Q6H PRN Audery AmelJohn T Clapacs, MD      . alum & mag hydroxide-simeth (MAALOX/MYLANTA) 200-200-20 MG/5ML suspension 30 mL  30 mL Oral Q4H PRN Audery AmelJohn T Clapacs, MD      . citalopram (CELEXA) tablet 20 mg  20 mg Oral Daily Audery AmelJohn T Clapacs, MD   20 mg at 07/26/16 0817  . magnesium hydroxide (MILK OF MAGNESIA) suspension 30 mL  30 mL Oral Daily PRN Audery AmelJohn T Clapacs, MD      . traZODone (DESYREL) tablet 100 mg  100 mg Oral QHS PRN Audery AmelJohn T Clapacs, MD       Current Outpatient Prescriptions  Medication Sig Dispense Refill  . citalopram (CELEXA) 20 MG tablet Take 1 tablet (20 mg total) by mouth daily. 30 tablet 0   PTA Medications: No prescriptions prior to admission.    Treatment Modalities: Medication Management, Group therapy, Case management,  1 to 1 session with clinician, Psychoeducation, Recreational therapy.   Physician Treatment Plan for Primary Diagnosis: Severe major depression, single episode, without psychotic features (HCC) Long Term Goal(s): Improvement in symptoms so as ready for discharge   Short Term Goals: Ability to identify changes in lifestyle to reduce recurrence of condition will improve, Ability to verbalize feelings will improve, Ability to disclose and discuss suicidal ideas, Ability to demonstrate self-control will improve and Ability to identify triggers associated with substance abuse/mental health issues will improve  Medication Management: Evaluate patient's  response, side effects, and tolerance of medication regimen.  Therapeutic Interventions: 1 to 1 sessions, Unit Group sessions and Medication administration.  Evaluation of Outcomes: Adequate for Discharge  Physician Treatment Plan for Secondary Diagnosis: Principal Problem:   Severe major depression, single episode, without psychotic features (HCC)  Long Term Goal(s): Improvement in symptoms so as ready for discharge  Short Term Goals: Ability to identify changes in lifestyle to reduce recurrence of condition will improve and Ability to identify triggers associated with substance abuse/mental health issues will improve  Medication Management: Evaluate patient's response, side effects, and tolerance of medication regimen.  Therapeutic Interventions: 1 to 1 sessions, Unit Group sessions and Medication administration.  Evaluation of Outcomes: Adequate for Discharge   RN Treatment Plan for Primary Diagnosis: Severe major depression, single episode, without psychotic features (HCC) Long Term Goal(s): Knowledge of disease and therapeutic regimen to maintain health will improve  Short Term Goals: Ability to remain free from injury will improve, Ability to verbalize frustration and anger appropriately will improve, Ability to participate in decision making will improve, Ability to identify and develop effective coping behaviors will improve and Compliance with prescribed medications will improve  Medication Management: RN will administer medications as ordered by provider, will assess and evaluate patient's response and provide education to patient for prescribed medication. RN will report any adverse and/or side effects to prescribing provider.  Therapeutic Interventions: 1 on 1 counseling sessions, Psychoeducation, Medication administration, Evaluate responses to treatment, Monitor vital signs and CBGs as ordered, Perform/monitor CIWA, COWS, AIMS and Fall Risk screenings as ordered, Perform  wound care treatments as ordered.  Evaluation of Outcomes:  Adequate for Discharge   LCSW Treatment Plan for Primary Diagnosis: Severe major depression, single episode, without psychotic features (HCC) Long Term Goal(s): Safe transition to appropriate next level of care at discharge, Engage patient in therapeutic group addressing interpersonal concerns.  Short Term Goals: Engage patient in aftercare planning with referrals and resources  Therapeutic Interventions: Assess for all discharge needs, 1 to 1 time with Social worker, Explore available resources and support systems, Assess for adequacy in community support network, Educate family and significant other(s) on suicide prevention, Complete Psychosocial Assessment, Interpersonal group therapy.  Evaluation of Outcomes: Adequate for Discharge   Progress in Treatment: Attending groups: Yes. Participating in groups: Yes. Taking medication as prescribed: Yes. Toleration medication: Yes. Family/Significant other contact made: No, patient declined for family contact. Patient understands diagnosis: Yes. Discussing patient identified problems/goals with staff: Yes. Medical problems stabilized or resolved: Yes. Denies suicidal/homicidal ideation: Yes. Issues/concerns per patient self-inventory: No. Other: n/a  New problem(s) identified: None identified at this time.   New Short Term/Long Term Goal(s): None identified at this time.   Discharge Plan or Barriers: Patient will discharge back home and follow-up with [facility].  Reason for Continuation of Hospitalization: Discharged 07/26/16  Estimated Length of Stay: Discharge 07/26/16  Attendees: Patient:Brent Peterson 07/26/2016 11:37 AM  Physician: Dr. Radene JourneyJayme Cloud, MD 07/26/2016 11:37 AM  Nursing: Hulan Amato, RN 07/26/2016 11:37 AM  RN Care Manager: 07/26/2016 11:37 AM  Social Worker: Fredrich Birks. Garnette Czech MSW, LCSWA 07/26/2016 11:37 AM  Recreational Therapist: Jacquelynn Cree, LRT/CTRS 07/26/2016 11:37 AM  Other:  07/26/2016 11:37 AM  Other:  07/26/2016 11:37 AM  Other: 07/26/2016 11:37 AM    Scribe for Treatment Team: Arelia Longest, LCSWA 07/26/2016 11:42 AM

## 2016-07-26 NOTE — Progress Notes (Signed)
  [facility] :  Will you be returning to the same living situation after discharge:  Yes,  home with girlfriend At discharge, do you have transportation home?: Yes,  sister Do you have the ability to pay for your medications: Yes,  patient has support from family and girlfriend  Release of information consent forms completed and in the chart;  Patient's signature needed at discharge.  Patient to Follow up at: Follow-up Information    [facility] Go in 3 day(s).   Why:  Go to [facility] for medication management and outpatient therapy. Walk-in hours for new patients are M,W,F 8a-3p. Please arrive early to ensure prompt service. Bring discharge summary, any medications, and photo ID. Contact information: [address] [place] [id number] [phone]           Next level of care provider [facility] Link:no  Safety Planning and Suicide Prevention discussed: Yes,  with patient  Have you used any form of tobacco in the last 30 days? (Cigarettes, Smokeless Tobacco, Cigars, and/or Pipes): Yes  Has patient been referred to the Quitline?: Patient refused referral  Patient has been referred for addiction treatment: Yes  Obie Silos G. Garnette CzechSampson MSW, LCSWA 07/26/2016, 11:44 AM

## 2016-09-15 ENCOUNTER — Emergency Department
Admission: EM | Admit: 2016-09-15 | Discharge: 2016-09-16 | Disposition: A | Discharge status: Other Acute Inpatient Hospital | Payer: No Typology Code available for payment source | Attending: Emergency Medicine | Admitting: Emergency Medicine

## 2016-09-15 DIAGNOSIS — F17210 Nicotine dependence, cigarettes, uncomplicated: Secondary | ICD-10-CM | POA: Insufficient documentation

## 2016-09-15 DIAGNOSIS — T39391A Poisoning by other nonsteroidal anti-inflammatory drugs [NSAID], accidental (unintentional), initial encounter: Secondary | ICD-10-CM

## 2016-09-15 DIAGNOSIS — F3112 Bipolar disorder, current episode manic without psychotic features, moderate: Secondary | ICD-10-CM

## 2016-09-15 DIAGNOSIS — T50902A Poisoning by unspecified drugs, medicaments and biological substances, intentional self-harm, initial encounter: Secondary | ICD-10-CM

## 2016-09-15 DIAGNOSIS — T1491XA Suicide attempt, initial encounter: Secondary | ICD-10-CM

## 2016-09-15 DIAGNOSIS — Z79899 Other long term (current) drug therapy: Secondary | ICD-10-CM | POA: Insufficient documentation

## 2016-09-15 DIAGNOSIS — T39312A Poisoning by propionic acid derivatives, intentional self-harm, initial encounter: Secondary | ICD-10-CM | POA: Insufficient documentation

## 2016-09-15 LAB — URINE DRUG SCREEN, QUALITATIVE (ARMC ONLY)
AMPHETAMINES, UR SCREEN: NOT DETECTED
BENZODIAZEPINE, UR SCRN: NOT DETECTED
Barbiturates, Ur Screen: NOT DETECTED
COCAINE METABOLITE, UR ~~LOC~~: NOT DETECTED
Cannabinoid 50 Ng, Ur ~~LOC~~: NOT DETECTED
MDMA (ECSTASY) UR SCREEN: NOT DETECTED
METHADONE SCREEN, URINE: NOT DETECTED
OPIATE, UR SCREEN: NOT DETECTED
PHENCYCLIDINE (PCP) UR S: NOT DETECTED
Tricyclic, Ur Screen: POSITIVE — AB

## 2016-09-15 LAB — CBC
HCT: 47.8 % (ref 40.0–52.0)
HEMOGLOBIN: 16.8 g/dL (ref 13.0–18.0)
MCH: 31.1 pg (ref 26.0–34.0)
MCHC: 35.1 g/dL (ref 32.0–36.0)
MCV: 88.6 fL (ref 80.0–100.0)
Platelets: 171 10*3/uL (ref 150–440)
RBC: 5.39 MIL/uL (ref 4.40–5.90)
RDW: 13.1 % (ref 11.5–14.5)
WBC: 9.1 10*3/uL (ref 3.8–10.6)

## 2016-09-15 LAB — COMPREHENSIVE METABOLIC PANEL
ALK PHOS: 47 U/L (ref 38–126)
ALT: 23 U/L (ref 17–63)
AST: 35 U/L (ref 15–41)
Albumin: 5.3 g/dL — ABNORMAL HIGH (ref 3.5–5.0)
Anion gap: 12 (ref 5–15)
BUN: 12 mg/dL (ref 6–20)
CALCIUM: 9.5 mg/dL (ref 8.9–10.3)
CHLORIDE: 103 mmol/L (ref 101–111)
CO2: 28 mmol/L (ref 22–32)
CREATININE: 1.12 mg/dL (ref 0.61–1.24)
GFR calc non Af Amer: >60 mL/min (ref >60)
Glucose, Bld: 132 mg/dL — ABNORMAL HIGH (ref 65–99)
Potassium: 3.3 mmol/L — ABNORMAL LOW (ref 3.5–5.1)
SODIUM: 143 mmol/L (ref 135–145)
Total Bilirubin: 2.9 mg/dL — ABNORMAL HIGH (ref 0.3–1.2)
Total Protein: 8.3 g/dL — ABNORMAL HIGH (ref 6.5–8.1)

## 2016-09-15 LAB — ETHANOL: Alcohol, Ethyl (B): <5 mg/dL (ref <5)

## 2016-09-15 LAB — CK: Total CK: 813 U/L — ABNORMAL HIGH (ref 49–397)

## 2016-09-15 LAB — ACETAMINOPHEN LEVEL

## 2016-09-15 LAB — SALICYLATE LEVEL
Salicylate Lvl: <7 mg/dL (ref 2.8–30.0)
Salicylate Lvl: <7 mg/dL (ref 2.8–30.0)

## 2016-09-15 MED ORDER — SODIUM CHLORIDE 0.9 % IV BOLUS (SEPSIS)
1000.0000 mL | Freq: Once | INTRAVENOUS | Status: AC
  Administered 2016-09-15: 1000 mL via INTRAVENOUS

## 2016-09-15 MED ORDER — SODIUM BICARBONATE 8.4 % IV SOLN
100.0000 meq | Freq: Once | INTRAVENOUS | Status: AC
  Administered 2016-09-15: 100 meq via INTRAVENOUS

## 2016-09-15 MED ORDER — QUETIAPINE FUMARATE 25 MG PO TABS
100.0000 mg | ORAL_TABLET | Freq: Every day | ORAL | Status: DC
  Administered 2016-09-15: 100 mg via ORAL
  Filled 2016-09-15: qty 4

## 2016-09-15 MED ORDER — SODIUM BICARBONATE 8.4 % IV SOLN
INTRAVENOUS | Status: AC
  Administered 2016-09-15: 100 meq via INTRAVENOUS
  Filled 2016-09-15: qty 100

## 2016-09-15 NOTE — ED Notes (Signed)
[facility]; spoke with Onalee Huaavid. [facility] recommendations are as follows:  1. Monitor for seizure like activity.  2. May see CNS symptoms - hallucinations, TACHYcardia, HTN, anticholinergic symptoms, and a widening of the QRS noted on ECG.  3. May have GI upset and elevated LFTs.  4. Obtain EKG, lytes, APAP, and Salicylate levels.   5. Treat with IVFs.  6. Monitor for 6 hours and then may clear medically if labs do not show significant findings.   [facility] recommendations communicated to attending EDP and primary nurse by this RN.

## 2016-09-15 NOTE — ED Provider Notes (Signed)
-----------------------------------------   6:10 PM on 09/15/2016 -----------------------------------------  Patient has been seen and evaluated by psychiatry. They will be admitting to their service for further treatment once a bed becomes available. Patient's labs are largely within normal limits besides a moderately elevated CK level of 800. We will encourage oral hydration.   Minna AntisKevin Suraiya Dickerson, MD 09/15/16 [phone]

## 2016-09-15 NOTE — ED Notes (Signed)
Pt pastor is here to visit - pt states he would like to visit with him - pt relations escorted pastor to pt bedside and will stay with visitor

## 2016-09-15 NOTE — ED Provider Notes (Signed)
[facility] Emergency Department Provider Note _   First MD Initiated Contact with Patient 09/15/16 [phone]     (approximate)  I have reviewed the triage vital signs and the nursing notes.   HISTORY  Chief Complaint Ingestion   HPI Brent Peterson is a 25 y.o. male with history of previous suicide attempts and major rash and presents via EMS with history of taking 40 tablets of Aleve PM at 10:30 PM last night (8800 mg of Naprosyn and 1000 mg of Benadryl). Patient admits to taking 40 tablets of Aleve PM however He denies any suicidal ideation states that he was not attempting to kill himself. Per EMS police stated that the patient had a argument last night with his girlfriend.   History reviewed. No pertinent past medical history.  Patient Active Problem List   Diagnosis Date Noted  . Severe major depression, single episode, without psychotic features (HCC) 07/22/2016  . Suicide attempt 07/22/2016    Past Surgical History:  Procedure Laterality Date  . APPENDECTOMY      Prior to Admission medications   Medication Sig Start Date End Date Taking? Authorizing Provider  citalopram (CELEXA) 20 MG tablet Take 1 tablet (20 mg total) by mouth daily. 07/26/16  Yes [name], MD    Allergies No known drug allergies No family history on file.  Social History Social History  Substance Use Topics  . Smoking status: Current Every Day Smoker    Types: Cigarettes  . Smokeless tobacco: Never Used  . Alcohol use Yes    Review of Systems Constitutional: No fever/chills Eyes: No visual changes. ENT: No sore throat. Cardiovascular: Denies chest pain. Respiratory: Denies shortness of breath. Gastrointestinal: No abdominal pain.  No nausea, no vomiting.  No diarrhea.  No constipation. Genitourinary: Negative for dysuria. Musculoskeletal: Negative for back pain. Skin: Negative for rash. Neurological: Negative for headaches, focal weakness or  numbness. Psychiatric:Positive for medication overdose 10-point ROS otherwise negative.  ____________________________________________   PHYSICAL EXAM:  VITAL SIGNS: ED Triage Vitals [09/15/16 0447]  Enc Vitals Group     BP (!) 135/92     Pulse Rate (!) 106     Resp 18     Temp 98.4 F (36.9 C)     Temp Source Oral     SpO2 100 %     Weight      Height      Head Circumference      Peak Flow      Pain Score      Pain Loc      Pain Edu?      Excl. in GC?     Constitutional: Alert and oriented. Well appearing and in no acute distress. Eyes: Conjunctivae are normal. PERRL. EOMI. Head: Atraumatic. Ears:  Healthy appearing ear canals and TMs bilaterally Nose: No congestion/rhinnorhea. Mouth/Throat: Mucous membranes are moist.  Oropharynx non-erythematous. Neck: No stridor.  No meningeal signs.  No cervical spine tenderness to palpation. Cardiovascular: Normal rate, regular rhythm. Good peripheral circulation. Grossly normal heart sounds. Respiratory: Normal respiratory effort.  No retractions. Lungs CTAB. Gastrointestinal: Soft and nontender. No distention.  Musculoskeletal: No lower extremity tenderness nor edema. No gross deformities of extremities. Neurologic:  Normal speech and language. No gross focal neurologic deficits are appreciated.  Skin:  Skin is warm, dry and intact. No rash noted. Psychiatric: Mood and affect are normal. Speech and behavior are normal.**}  ____________________________________________   LABS (all labs ordered are listed, but only abnormal results  are displayed)  Labs Reviewed  COMPREHENSIVE METABOLIC PANEL - Abnormal; Notable for the following:       Result Value   Potassium 3.3 (*)    Glucose, Bld 132 (*)    Total Protein 8.3 (*)    Albumin 5.3 (*)    Total Bilirubin 2.9 (*)    All other components within normal limits  ACETAMINOPHEN LEVEL - Abnormal; Notable for the following:    Acetaminophen (Tylenol), Serum <10 (*)    All other  components within normal limits  URINE DRUG SCREEN, QUALITATIVE ([facility] ONLY) - Abnormal; Notable for the following:    Tricyclic, Ur Screen POSITIVE (*)    All other components within normal limits  CK - Abnormal; Notable for the following:    Total CK 813 (*)    All other components within normal limits  CBC  SALICYLATE LEVEL  ETHANOL  SALICYLATE LEVEL   ____________________________________________  EKG  ED ECG REPORT I, Crown N Zamyah Wiesman, the attending physician, personally viewed and interpreted this ECG.   Date: 09/15/2016  EKG Time: 4:42 AM  Rate: 94  Rhythm: Normal sinus rhythm  Axis: Normal  Intervals: Normal  ST&T Change: No ST segment elevation however peak T waves    Procedures   Critical Care performed: CRITICAL CARE Performed by: Darci CurrentANDOLPH N Moriah Shawley   Total critical care time: 40 minutes  Critical care time was exclusive of separately billable procedures and treating other patients.  Critical care was necessary to treat or prevent imminent or life-threatening deterioration.  Critical care was time spent personally by me on the following activities: development of treatment plan with patient and/or surrogate as well as nursing, discussions with consultants, evaluation of patient's response to treatment, examination of patient, obtaining history from patient or surrogate, ordering and performing treatments and interventions, ordering and review of laboratory studies, ordering and review of radiographic studies, pulse oximetry and re-evaluation of patient's condition. ____________________________________________   INITIAL IMPRESSION / ASSESSMENT AND PLAN / ED COURSE  Pertinent labs & imaging results that were available during my care of the patient were reviewed by me and considered in my medical decision making (see chart for details).  Given history of physical exam concern for possible naproxen overdose in addition to potential other ingestion salicylate  Tylenol level as well as urine drug screen pending. UDS revealed positive TCA as such patient received sodium bicarbonate. Poison control recommendation 6 hours observation and then medically cleared for psychiatric evaluation.   Clinical Course     ____________________________________________  FINAL CLINICAL IMPRESSION(S) / ED DIAGNOSES  Final diagnoses:  Medication overdose, intentional self-harm, initial encounter (HCC)  Suicide attempt     MEDICATIONS GIVEN DURING THIS VISIT:  Medications  sodium chloride 0.9 % bolus 1,000 mL (not administered)  sodium chloride 0.9 % bolus 1,000 mL (1,000 mLs Intravenous New Bag/Given 09/15/16 0453)  sodium bicarbonate injection 100 mEq (100 mEq Intravenous Given 09/15/16 0551)     NEW OUTPATIENT MEDICATIONS STARTED DURING THIS VISIT:  New Prescriptions   No medications on file    Modified Medications   No medications on file    Discontinued Medications   No medications on file     Note:  This document was prepared using Dragon voice recognition software and may include unintentional dictation errors.    Darci Currentandolph N Mylani Gentry, MD 09/15/16 [phone]
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
## 2016-09-15 NOTE — ED Notes (Signed)
The patient was dressed out into the required purple scrubs. His cloths were placed into a white patient belongings bag. Labeled correctly. His belongings were left with the quad RN. He was tyhen moved from rm26 to hallway bed 19. No issues with the dressing out process.

## 2016-09-15 NOTE — ED Notes (Signed)
Pt is alert and oriented this evening. Pt mood is sad/depressed but he responds appropriately and is pleasant and cooperative with staff. Writer discussed tx plan and pt understands he will be transferred to the BMU. Writer provided fluids and food. Pt is voiding and drinking fluids. 15 minute checks are ongoing for safety.

## 2016-09-15 NOTE — ED Notes (Signed)
Pt girlfriend here to visit with pt - pt relations escorted visitor to pt beside and stayed with her during visit

## 2016-09-15 NOTE — ED Notes (Signed)
Revonda Standardllison from poison controlled called to check on patient. Revonda Standardllison informed patient is currently stable, patient is being given a second liter of fluid and salicylate level was just redrawn. Revonda Standardllison states that they will call back later this afternoon to check on patient again.

## 2016-09-15 NOTE — ED Notes (Signed)
20 gauge IV removed from pt's right AC. Site is dry, clean, and intact with no bruising noted.

## 2016-09-15 NOTE — ED Notes (Signed)
This tech was asked to sit 1:1 with this patient because he was repeatedly trying to get out of bed. Sitting within arm's reach of patient at his bedside.

## 2016-09-15 NOTE — ED Notes (Signed)
Pt noted to be reading the sanitation wipe label and looking around - when pt thought no one was looking he took the sanitation wipes out of the wall holder and placed between his legs on the bed - this nurse observed this happening and removed the wipes from pt area - security and charge nurse notified

## 2016-09-15 NOTE — ED Notes (Signed)
[facility] - [facility] has [facility] this pt for transfer - to bring pt to the unit in 20 minutes

## 2016-09-15 NOTE — ED Notes (Signed)
Pt denies SI/HI and AVH. Pt states he was brought to the hospital because "I took a large mount of pills."  Pt denies any intent to harm himself. "It was an impulsive decision."  Pt was smiling while telling this to this Clinical research associatewriter.  Pt denies pain.  RN noticed pt has left arm wrapped and asked pt if he had an injury. Pt then told RN that was his IV site. Nurses called charge nurse and had IV and wrap removed immediately.   Maintained on 15 minute checks and observation by security camera for safety.

## 2016-09-16 ENCOUNTER — Inpatient Hospital Stay
Admission: AD | Admit: 2016-09-16 | Discharge: 2016-09-22 | DRG: 885 | Disposition: A | Discharge status: Home / Self Care | Payer: No Typology Code available for payment source | Source: Intra-hospital | Attending: Psychiatry | Admitting: Psychiatry

## 2016-09-16 DIAGNOSIS — Z79899 Other long term (current) drug therapy: Secondary | ICD-10-CM | POA: Diagnosis not present

## 2016-09-16 DIAGNOSIS — F321 Major depressive disorder, single episode, moderate: Secondary | ICD-10-CM | POA: Diagnosis present

## 2016-09-16 DIAGNOSIS — F3160 Bipolar disorder, current episode mixed, unspecified: Secondary | ICD-10-CM | POA: Diagnosis present

## 2016-09-16 DIAGNOSIS — F17210 Nicotine dependence, cigarettes, uncomplicated: Secondary | ICD-10-CM | POA: Diagnosis present

## 2016-09-16 DIAGNOSIS — T1491XA Suicide attempt, initial encounter: Secondary | ICD-10-CM | POA: Diagnosis present

## 2016-09-16 DIAGNOSIS — Z915 Personal history of self-harm: Secondary | ICD-10-CM

## 2016-09-16 DIAGNOSIS — R45851 Suicidal ideations: Secondary | ICD-10-CM | POA: Diagnosis not present

## 2016-09-16 DIAGNOSIS — Z818 Family history of other mental and behavioral disorders: Secondary | ICD-10-CM | POA: Diagnosis not present

## 2016-09-16 DIAGNOSIS — Z9049 Acquired absence of other specified parts of digestive tract: Secondary | ICD-10-CM | POA: Diagnosis not present

## 2016-09-16 DIAGNOSIS — T39391A Poisoning by other nonsteroidal anti-inflammatory drugs [NSAID], accidental (unintentional), initial encounter: Secondary | ICD-10-CM | POA: Diagnosis present

## 2016-09-16 HISTORY — DX: Bipolar disorder, unspecified: F31.9

## 2016-09-16 HISTORY — DX: Major depressive disorder, single episode, unspecified: F32.9

## 2016-09-16 HISTORY — DX: Depression, unspecified: F32.A

## 2016-09-16 LAB — LIPID PANEL
Cholesterol: 148 mg/dL (ref 0–200)
HDL: 51 mg/dL (ref >40)
LDL CALC: 85 mg/dL (ref 0–99)
Total CHOL/HDL Ratio: 2.9 RATIO
Triglycerides: 59 mg/dL (ref <150)
VLDL: 12 mg/dL (ref 0–40)

## 2016-09-16 LAB — TSH: TSH: 1.002 u[IU]/mL (ref 0.350–4.500)

## 2016-09-16 MED ORDER — HYDROXYZINE HCL 25 MG PO TABS: 25.0000 mg | ORAL_TABLET | Freq: Three times a day (TID) | ORAL | Status: DC | PRN

## 2016-09-16 MED ORDER — ACETAMINOPHEN 325 MG PO TABS: 650.0000 mg | ORAL_TABLET | Freq: Four times a day (QID) | ORAL | Status: DC | PRN

## 2016-09-16 MED ORDER — QUETIAPINE FUMARATE 100 MG PO TABS
100.0000 mg | ORAL_TABLET | Freq: Every day | ORAL | Status: DC
  Administered 2016-09-16: 100 mg via ORAL
  Filled 2016-09-16: qty 1

## 2016-09-16 MED ORDER — QUETIAPINE FUMARATE 100 MG PO TABS: 100.0000 mg | ORAL_TABLET | Freq: Every day | ORAL | Status: DC

## 2016-09-16 MED ORDER — MAGNESIUM HYDROXIDE 400 MG/5ML PO SUSP: 30.0000 mL | Freq: Every day | ORAL | Status: DC | PRN

## 2016-09-16 MED ORDER — ALUM & MAG HYDROXIDE-SIMETH 200-200-20 MG/5ML PO SUSP: 30.0000 mL | ORAL | Status: DC | PRN

## 2016-09-16 NOTE — Progress Notes (Signed)
Patient currently in the dayroom with staff and peers. Pleasant and cooperative. Alert and oriented "everything is fine" smiling. Interacting with staff and peers appropriately. No sign of discomfort. Staff continue to offer support and encouragements. Therapeutic milieu promoted. Remains safe on the unit.

## 2016-09-16 NOTE — Progress Notes (Signed)
Pt awake, alert, up on unit today. Appropriately interacts with peers/staff. Smiles/ brightens on approach. Pleasant and cooperative. Reports good sleep last night without the use of sleep medication. Reports good appetite, normal energy, good concentration. Denies SI/HI/AVH. Rates depression, hopelessness, anxiety 0/10 (low 0-10). Goal today is "getting out" by "go to group." Pt continues to minimize his unintentional act of harming self. Requests "to bring laptop for schoolwork," which was denied. Attends group.  Support and encouragement provided with use of therapeutic communication. Medications administered as ordered with education. Safety maintained with every 15 minute checks. Will continue to monitor.

## 2016-09-16 NOTE — Progress Notes (Signed)
Pt's girlfriend here to visit. Pt and girlfriend request that girlfriend take pt's keys so that she can move his vehicle. The vehicle is reportedly parked illegally here at the hospital. Keys taken from pt's specific locker and given to pt's girlfriend. Documented in paper chart.
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
## 2016-09-16 NOTE — Plan of Care (Signed)
Problem: Education: Goal: Knowledge of [facility] Education information/materials will improve Outcome: Progressing Pt was educated on the unit and rules.  Problem: Safety: Goal: Ability to remain free from injury will improve Outcome: Progressing Pt will remain free form injury.  Problem: Pain Managment: Goal: General experience of comfort will improve Outcome: Progressing Pt denies having any pain or discomfort.  Problem: Coping: Goal: Ability to cope will improve Outcome: Progressing Pt will discuss positive coping mechanisms during treatment team and group.

## 2016-09-16 NOTE — [facility] Group Notes (Signed)
[facility] LCSW Group Therapy  09/16/2016 3:04 PM  Type of Therapy:  Group Therapy  Participation Level:  Minimal  Participation Quality:  Attentive  Affect:  Appropriate  Cognitive:  Alert  Insight:  Poor  Engagement in Therapy:  Limited  Modes of Intervention:  Activity, Discussion, Education and Support  Summary of Progress/Problems:Self esteem: Patients discussed self esteem and how it impacts them. They discussed what aspects in their lives has influenced their self esteem. They were challenged to identify changes that are needed in order to improve self esteem. Patients participated in activity where they had to identify positive adjectives they felt described their personality. Patients shared with the group on the following areas: Things I am good at, What I like about my appearance, I've helped others by, What I value the most, compliments I have received, challenges I have overcome, thing that make me unique, and Times I've made others happy. Patient discussed his depression and felt his depression was resolved, however he could not state why. Patient minimized his recent overdose.   Leland Staszewski G. Garnette CzechSampson MSW, LCSWA 09/16/2016, 3:13 PM
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
## 2016-09-16 NOTE — Plan of Care (Signed)
Problem: Safety: Goal: Ability to disclose and discuss suicidal ideas will improve Outcome: Not Progressing Pt continues to minimize his actions of "unintentional" act(s) of harming self. Denies SI/HI/AVH.

## 2016-09-16 NOTE — Tx Team (Signed)
Initial Treatment Plan 09/16/2016 2:49 AM Brent Peterson [id number]    PATIENT STRESSORS: Marital or family conflict   PATIENT STRENGTHS: Communication skills Physical Health Special hobby/interest Supportive family/friends   PATIENT IDENTIFIED PROBLEMS: "I don't know. I don't have any problems."                     DISCHARGE CRITERIA:  Improved stabilization in mood, thinking, and/or behavior Motivation to continue treatment in a less acute level of care Need for constant or close observation no longer present Reduction of life-threatening or endangering symptoms to within safe limits Safe-care adequate arrangements made Verbal commitment to aftercare and medication compliance  PRELIMINARY DISCHARGE PLAN: Attend aftercare/continuing care group Outpatient therapy Participate in family therapy Return to previous living arrangement  PATIENT/FAMILY INVOLVEMENT: This treatment plan has been presented to and reviewed with the patient, Brent Peterson, and/or family member. The patient and family have been given the opportunity to ask questions and make suggestions.  Marla Roeunisha L Shelbi Vaccaro, RN 09/16/2016, 2:49 AM
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
## 2016-09-17 MED ORDER — QUETIAPINE FUMARATE 100 MG PO TABS
100.0000 mg | ORAL_TABLET | Freq: Two times a day (BID) | ORAL | Status: DC
  Administered 2016-09-17 – 2016-09-18 (×2): 100 mg via ORAL
  Filled 2016-09-17 (×2): qty 1

## 2016-09-17 NOTE — Progress Notes (Signed)
Pt awake, alert, up on unit today. Appropriately interacts with peers/staff. Noted to be in the dayroom socializing with peers and attends group. Pt requested his shoes from locker, ok to remove strings per pt, updated in paper chart. Continues to deny SI/HI/AVH. Continues to minimize reason for admission. Reports good sleep last night without sleep medication. Reports good appetite, normal energy, good concentration. Rates depression, anxiety, and hopelessness 0/10 (low 0-10 high). States goal for today is "getting out" by "go to group."   Support and encouragement provided with use of therapeutic communication. No medications are ordered to give during the daytime, but [facility], pt is compliant with night medications.Safety maintained with every 15 minute checks. Will continue to monitor.

## 2016-09-17 NOTE — Progress Notes (Signed)
Patient had his bed time medications, stayed in the dayroom then went to bed. Has been sleeping comfortably. Had no concern and safety maintained.

## 2016-09-17 NOTE — Plan of Care (Signed)
Problem: Activity: Goal: Interest or engagement in leisure activities will improve Outcome: Progressing Pt awake, alert, up in dayroom most of the day. Interacts/socializes appropriately with peers/staff.

## 2016-09-17 NOTE — [facility] Counselor (Signed)
Adult Comprehensive Assessment  Patient ID: Brent Peterson, male   DOB: May 28, 1991, 25 y.o.   MRN: [id number]  Information Source: Information source: Patient  Current Stressors:  Educational / Learning stressors: n/a Employment / Job issues: Pt has a job at [employer].  Family Relationships: Pt stated him and his mother are not getting along right now. Pt states she does not agree with his relationship and feels it is unhealthy.  Financial / Lack of resources (include bankruptcy): n/a Housing / Lack of housing: Pt is currently living with girlfriend. Patient just got [place] from [place] where he was living for about 6 weeks.  Physical health (include injuries & life threatening diseases): n/a Social relationships: n/a Substance abuse: n/a Bereavement / Loss: n/a  Living/Environment/Situation:  Living Arrangements: Spouse/significant other, Children Living conditions (as described by patient or guardian): Pt states it is going good. Pt also states "I mean every couple has arguments but we're good".  How long has patient lived in current situation?: since October 28th 2017.  What is atmosphere in current home: Comfortable, Loving  Family History:  Marital status: Long term relationship Long term relationship, how long?: off and on for four years. What types of issues is patient dealing with in the relationship?: Pt states they argue a lot sometimes. Additional relationship information: n/a Are you sexually active?: Yes What is your sexual orientation?: heterosexual Has your sexual activity been affected by drugs, alcohol, medication, or emotional stress?: n/a Does patient have children?: Yes How many children?: 1 How is patient's relationship with their children?: Patient has 23 year old son and they have a good relationship.   Childhood History:  By whom was/is the patient raised?: Other (Comment) (Pt states he was raised by a lot of different people) Additional childhood history  information: Pt states he started off living with his father and later moved in with other family members. Then he moved in with mother and later moved in with other family members. Description of patient's relationship with caregiver when they were a child: Pt stated he had an "okay" relationship with his parents but it was never consistent.  Patient's description of current relationship with people who raised him/her: Pt states "We have an okay relationship".  How were you disciplined when you got in trouble as a child/adolescent?: Pt states his father gave him spankings but it wasn't abuse.  Does patient have siblings?: Yes Number of Siblings: 7 Description of patient's current relationship with siblings: Pt states he mostly close with his siblings but did not elaborate further.  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?:  No Are There Guns or Other Weapons in Your Home?: No Are These Weapons Safely Secured?:  (n/a)  Financial Resources:   Financial resources: Income from employment Does patient have a representative payee or guardian?: No  Alcohol/Substance Abuse:   What has been your use of drugs/alcohol within the last 12 months?: Cannabis and Alcohol If attempted suicide, did drugs/alcohol play a role in this?: No Alcohol/Substance Abuse Treatment Hx: Denies past history If yes, describe treatment: n/a Has alcohol/substance abuse ever caused legal problems?: No  Social Support System:   Patient's Community Support System: Fair Describe Community Support System: Girlfriend Type of faith/religion: [employer] How does patient's faith help to cope with current illness?: Pt states "My faith is important to me".  Leisure/Recreation:   Leisure and Hobbies: Writing, listen to music, spend time with his son, play his play station  Strengths/Needs:   What things does the patient do well?: Pt states "I am a leader, passionate, family oriented." In what areas does patient struggle / problems for patient: selfishness and depression  Discharge Plan:   Does patient have access to transportation?: Yes (Girlfriend) Will patient be returning to same living situation after discharge?: Yes Currently receiving community mental health services: Yes (From Whom) Does patient have financial barriers related to discharge medications?: No  Summary/Recommendations:   Patient is a 25 year old male admitted involuntarily with a diagnosis of Bipolar disorder, mixed. Information was obtained from psychosocial assessment completed with patient and chart review conducted by this evaluator. Patient presented to the hospital after ingesting 40 Aleve pm and Benadryl. Patient denies that this was a suicide attempt stating "I just wanted to see what would happen." [facility].  Patient reports primary triggers for admission were recently moving back [place], after living there for about 6 weeks and not being on his psychiatric medications. 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 Patient states his girlfriend is the only support to him right now. Patient plans to follow-up with [facility] when discharged to receive outpatient therapy and comply with medication management. Patient will benefit from crisis stabilization, medication evaluation, group therapy and psycho education in addition to case management for discharge. At discharge, it is recommended that patient remain compliant with established discharge plan and continued treatment.    Brent Springsteen G. Garnette CzechSampson MSW, [facility] 09/17/2016 11:24 AM

## 2016-09-17 NOTE — Progress Notes (Signed)
1930: Patient in room. Alert and oriented x 4. Denies thoughts of self harm. Denies homicidal thoughts. Denies hallucinations. "everything is fine..just resting a little bit". No sign of discomfort noted. 15 mn observations maintained for safety.

## 2016-09-17 NOTE — Plan of Care (Signed)
Problem: Health Behavior/Discharge Planning: Goal: Ability to manage health-related needs will improve Outcome: Progressing Knowledgeable of his mental illness and willing to care for himself

## 2016-09-17 NOTE — Progress Notes (Signed)
Patient currently in bed awake. Alert and oriented. Pleasant and cooperative. Denies thoughts of self harm, denies hallucinations. Communicating clearly and politely "my mom, my dad and my fiancee coming to see me today..." . Therapeutic milieu promoted and safety maintained per 1:1 observations.

## 2016-09-17 NOTE — Plan of Care (Signed)
Problem: Coping: Goal: Ability to cope will improve Outcome: Progressing Patient visible in the milieu, attending groups, interacting with staff and peers pleasantly

## 2016-09-17 NOTE — [facility] Group Notes (Signed)
[facility] LCSW Group Therapy  09/17/2016 2:55 PM  Type of Therapy:  Group Therapy  Participation Level:  Active  Participation Quality:  Appropriate and Sharing  Affect:  Appropriate  Cognitive:  Alert  Insight:  Developing/Improving  Engagement in Therapy:  Engaged  Modes of Intervention:  Activity, Discussion, Education and Support  Summary of Progress/Problems:Coping Skills: Patients defined and discussed healthy coping skills. Patients identified healthy coping skills they would like to try during hospitalization and after discharge. CSW offered insight to varying coping skills that may have been new to patients such as practicing mindfulness. Patient stated the mindfulness exercise made him feel more relaxed. Patient discussed writing and playing his play station helps dealing with stress.   Milferd Ansell G. Garnette CzechSampson MSW, LCSWA 09/17/2016, 2:57 PM
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
## 2016-09-18 DIAGNOSIS — F321 Major depressive disorder, single episode, moderate: Secondary | ICD-10-CM

## 2016-09-18 LAB — HEMOGLOBIN A1C
HEMOGLOBIN A1C: 5 % (ref 4.8–5.6)
MEAN PLASMA GLUCOSE: 97 mg/dL

## 2016-09-18 LAB — PROLACTIN: Prolactin: 6.7 ng/mL (ref 4.0–15.2)

## 2016-09-18 MED ORDER — CITALOPRAM HYDROBROMIDE 20 MG PO TABS
20.0000 mg | ORAL_TABLET | Freq: Every day | ORAL | Status: DC
  Administered 2016-09-18 – 2016-09-22 (×5): 20 mg via ORAL
  Filled 2016-09-18 (×5): qty 1

## 2016-09-18 MED ORDER — QUETIAPINE FUMARATE 200 MG PO TABS
200.0000 mg | ORAL_TABLET | Freq: Every day | ORAL | Status: DC
  Administered 2016-09-19 – 2016-09-21 (×3): 200 mg via ORAL
  Filled 2016-09-18 (×3): qty 1

## 2016-09-18 NOTE — Plan of Care (Signed)
Problem: Self-Concept: Goal: Ability to disclose and discuss suicidal ideas will improve Outcome: Not Progressing Pt denies SI. Has minimized his suicidal tendencies since admission. Does acknowledge that his actions must change.

## 2016-09-18 NOTE — Progress Notes (Signed)
Patient slept all night. Had no concern. Currently out of bed, calm and cooperative. Denies SI/HI, denies hallucinations. Staff continue to provide support and encouragements. Safety and security maintained.

## 2016-09-18 NOTE — [facility] Group Notes (Signed)
[facility] Group Notes:  (Nursing/MHT/Case Management/Adjunct)  Date:  09/18/2016  Time:  6:39 PM  Type of Therapy:  Psychoeducational Skills  Participation Level:  Did Not Attend  Brent Peterson 09/18/2016, 6:39 PM
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
## 2016-09-18 NOTE — Progress Notes (Signed)
Patient stayed in the milieu, attended group and remained cooperative. Currently in bed sleeping. No sign of discomfort. Safety and security maintained.

## 2016-09-18 NOTE — Plan of Care (Signed)
Problem: Coping: Goal: Ability to cope will improve Outcome: Progressing Emotional support and encouragements provided. Patient has adjusted well and reports improvement in mood. Attending groups and states that groups are helping "I am learning a lot.Marland Kitchen.ibuprofen know I have to get better for my son.Marland Kitchen.Marland Kitchen."

## 2016-09-18 NOTE — Progress Notes (Signed)
Recreation Therapy Notes  Date: 11.13.17 Time: 1:00 pm Location: Craft Room  Group Topic: Wellness  Goal Area(s) Addresses:  Patient will identify at least one item per dimension of health. Patient will examine areas they are deficient in.  Behavioral Response: Attentive, Interactive  Intervention: 6 Dimensions of Health  Activity: Patients were given a definition sheet with the 6 Dimensions of Health and a worksheet with each dimension listed. Patients were instructed to write at least one item in each dimension that they were doing before they came to the hospital. LRT encouraged patient to write 2-3 items.  Education: LRT educated patients on ways to improve each dimension.  Education Outcome: Acknowledges education/In group clarification offered   Clinical Observations/Feedback: Patient wrote at least 2 items in each dimension. Patient contributed to group discussion by stating what area he was giving enough attention to, what he can do to improve certain dimensions, how this activity relates to his admission, how this activity relates to his d/c, and what would change for him if he kept himself more aware of his wellness.  Jacquelynn CreeGreene,Hendrixx Severin M, LRT/CTRS 09/18/2016 2:09 PM

## 2016-09-18 NOTE — [facility] Suicide Risk Assessment (Signed)
[facility] INPATIENT:  Family/Significant Other Suicide Prevention Education  Suicide Prevention Education:  Education Completed; girlfriend, Brent Peterson, [place] [phone]#:[phone] has been identified by the patient as the family member/significant other with whom the patient will be residing, and identified as the person(s) who will aid the patient in the event of a mental health crisis (suicidal ideations/suicide attempt).  With written consent from the patient, the family member/significant other has been provided the following suicide prevention education, prior to the and/or following the discharge of the patient. Pt's girlfriend stated that she will remove over-the-counter medications and other potential weapons. She stated that this suicide attempt was a surprise and is taking all precautions to prevent and decrease risk of potential suicide attempts.  The suicide prevention education provided includes the following:  Suicide risk factors  Suicide prevention and interventions  National Suicide Hotline telephone number  [facility] assessment telephone number  [facility] Emergency Assistance 911  [facility] and/or Residential Mobile Crisis Unit telephone number  Request made of family/significant other to:  Remove weapons (e.g., guns, rifles, knives), all items previously/currently identified as safety concern.    Remove drugs/medications (over-the-counter, prescriptions, illicit drugs), all items previously/currently identified as a safety concern.  The family member/significant other verbalizes understanding of the suicide prevention education information provided.  The family member/significant other agrees to remove the items of safety concern listed above.  Lynden OxfordKadijah R Michaela Peterson, MSW, LCSW-A 09/18/2016, 2:36 PM

## 2016-09-18 NOTE — Care Management (Signed)
[facility] and I both had concerns about the mental wellness of Brent Peterson. While pleasant and cooperative while in our care he has not broached the issues that brought him into our care. After having a consult with him earlier I approached the subject and he was honest about his discomfort around why he is here. His mother who lives in [place] moved him up there with her after his last episode of attempted suicide in an effort to lessen his stress. 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 When he communicated with her that he [place] in an effort to be a provider and sort out his life she became irate and completely cut ALL communication with him (she blocked his mobile number and unfriended him on Facebook from what I recall). This was devastating for him because him and his mother are best friends and have always had a close relationship from what he says. He went to a dark place and stopped taking his medicine again. He acknowledges and believes that he was given the tools to cope and progress when he first came into our care, but that its on him to use those tools and do the things he needs to do for him. I am encouraged that he is taking responsibility for his own life and focusing on himself.

## 2016-09-18 NOTE — Plan of Care (Signed)
Problem: Medication: Goal: Compliance with prescribed medication regimen will improve Outcome: Progressing Taking medications as prescribed. Has no concern about his medications.

## 2016-09-18 NOTE — Progress Notes (Signed)
Pt awake, alert, up on unit without complaints today. Interacts appropriately and socializes with staff/peers. Reports good sleep without medication. Reports good appetite, normal energy, and good concentration. Rates anxiety, depression and hopelessness 0/10 (low 0-10 high). Denies SI/HI/AVH. Goal is "getting out" by "going to group." Pt is calm, cooperative, pleasant. Attends groups. Medication complaint.   Support and encouragement provided with use of therapeutic communication. Medications administered as ordered with education. Safety maintained with every 15 minute checks. Will continue to monitor.

## 2016-09-18 NOTE — Progress Notes (Signed)
Recreation Therapy Notes  INPATIENT RECREATION THERAPY ASSESSMENT  Patient Details Name: Oretha EllisDaymond R Haacke MRN: [id number] DOB: 06-Dec-1990 Today's Date: 09/18/2016  Patient Stressors: Family (Got in a big fight with mom because mom feels like patient is putting girlfriend before her)  Coping Skills:   Isolate, Exercise, Art/Dance, Talking, Music, Sports, Other (Comment) (Praying, going to church, going for a drive)  Personal Challenges:  Patient reported no personal challenges.  Leisure Interests (2+):  Individual - Other (Comment) (Playing with son, playing with dog)  Awareness of Community Resources:  Yes  Community Resources:  Research scientist (physical sciences)Movie Theaters, [employer]  Current Use: Yes  If no, Barriers?:    Patient Strengths:  Big heart, charming  Patient Identified Areas of Improvement:  Learning how to cope without the medicine and placing more trust onto God  Current Recreation Participation:  Playing with son, playing with dog, working, spending time with girlfriend  Patient Goal for Hospitalization:  To keep the coping mechanisms in his mind  [place]  [place]idence[place]  [place]   Current SI (including self-harm):  No  Current HI:  No  Consent to Intern Participation: N/A  LRT offered 1:1 treatment services, but patient declined. Due to patient declining, LRT will not develop a Recreational Therapy Care Plana at this time. If patient's status changes, LRT will develop a Recreational Therapy Care Plan.  Jacquelynn CreeGreene,Everette Mall M, LRT/CTRS  09/18/2016, 11:49 AM

## 2016-09-18 NOTE — [facility] Group Notes (Signed)
[facility] Group Notes:  (Nursing/MHT/Case Management/Adjunct)  Date:  09/18/2016  Time:  12:49 AM  Type of Therapy:  Psychoeducational Skills  Participation Level:  Active  Participation Quality:  Appropriate and Sharing  Affect:  Appropriate  Cognitive:  Appropriate  Insight:  Appropriate, Good and Improving  Engagement in Group:  Engaged  Modes of Intervention:  Discussion, Socialization and Support  Summary of Progress/Problems:  Brent MilroyLaquanda Peterson [name] 09/18/2016, 12:49 AM

## 2016-09-19 LAB — VITAMIN B12: VITAMIN B 12: 284 pg/mL (ref 180–914)

## 2016-09-19 NOTE — [facility] Group Notes (Signed)
[facility] Group Notes:  (Nursing/MHT/Case Management/Adjunct)  Date:  09/19/2016  Time:  5:21 AM  Type of Therapy:    Participation Level:  Active  Participation Quality:  Appropriate  Affect:  Appropriate  Cognitive:  Alert  Insight:  Good  Engagement in Group:  Supportive  Modes of Intervention:  Activity  Summary of Progress/Problems:  Brent Peterson 09/19/2016, 5:21 AM

## 2016-09-19 NOTE — Progress Notes (Signed)
Recreation Therapy Notes  Date: 11.14.17 Time: 3:00 pm Location: Craft Room  Group Topic: Self-expression  Goal Area(s) Addresses: Patient will write emotions they are feeling. Patient will verbalize benefit of using art as a means of self-expression  Behavioral Response: Attentive, Interactive  Intervention: Bottled Up  Activity: Patients were instructed to draw a bottle the way they see themselves and write the emotions they are feeling inside the bottle.  Education: LRT educated patients on other forms of self-expression.  Education Outcome: Acknowledges education/In group clarification offered  Clinical Observations/Feedback: Patient drew bottle and wrote the emotions he was feeling inside the bottle. Patient contributed to group discussion by stating why his bottle was shaped the way it was, what feeling he thinks is most prominent, how this activity made him feel, that it was easy for him to see his emotions on paper and why, and that he felt better getting his emotions on paper.  Jacquelynn CreeGreene,Kathyleen Radice M, LRT/CTRS 09/19/2016 3:50 PM

## 2016-09-19 NOTE — Plan of Care (Signed)
Problem: Coping: Goal: Ability to cope will improve Outcome: Progressing Working on  Coping skills     

## 2016-09-19 NOTE — Progress Notes (Signed)
D: Pt is pleasant and cooperative this evening. He spends most of the evening in the milieu interacting appropriately with staff and peers. Pt reports that he ran out of his medications prior to arrival. He states "next time I'm going to use the coping skills I've learned both during this admission and the last one." He rates depression and anxiety 0/10. Denies SI/HI/AVH at this time. A: Emotional support and encouragement provided. Medications administered with education. q15 minute safety checks maintained. R: Pt remains free from harm. Will continue to monitor.

## 2016-09-19 NOTE — Plan of Care (Signed)
Problem: Self-Concept: Goal: Level of anxiety will decrease Outcome: Progressing Pt rates anxiety 0/10 at this time.  Problem: Coping: Goal: Ability to interact with others will improve Outcome: Progressing Pt spends most of the evening in the milieu interacting appropriately with staff and peers.

## 2016-09-19 NOTE — [facility] Group Notes (Signed)
[facility] Group Notes:  (Nursing/MHT/Case Management/Adjunct)  Date:  09/19/2016  Time:  9:53 PM  Type of Therapy:  Evening Wrap-up Group  Participation Level:  Active  Participation Quality:  Appropriate and Attentive  Affect:  Appropriate  Cognitive:  Alert and Appropriate  Insight:  Appropriate, Good and Improving  Engagement in Group:  Developing/Improving and Engaged  Modes of Intervention:  Discussion  Summary of Progress/Problems:  Tomasita MorrowChelsea Nanta Santresa Levett 09/19/2016, 9:53 PM
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
## 2016-09-19 NOTE — Progress Notes (Signed)
D: Patient voice of his focus is going home . Patient stated slept good last night .Stated appetite is good and energy level  Is normal. Stated concentration is good . Stated on Depression scale 0 , hopeless 0 and anxiety 0 .( low 0-10 high) Denies suicidal  homicidal ideations  .  No auditory hallucinations  No pain concerns . Appropriate ADL'S. Interacting with peers and staff.  A: Encourage patient participation with unit programming . Instruction  Given on  Medication , verbalize understanding. R: Voice no other concerns. Staff continue to monitor

## 2016-09-19 NOTE — [facility] Group Notes (Signed)
[facility] Group Notes:  (Nursing/MHT/Case Management/Adjunct)  Date:  09/19/2016  Time:  2:20 PM  Type of Therapy:  Psychoeducational Skills  Participation Level:  Active  Participation Quality:  Appropriate  Affect:  Appropriate  Cognitive:  Appropriate  Insight:  Appropriate  Engagement in Group:  Engaged  Modes of Intervention:  Discussion and Education  Summary of Progress/Problems:  [name] 09/19/2016, 2:20 PM

## 2016-09-19 NOTE — [facility] Group Notes (Signed)
[facility] LCSW Group Therapy Note  Date/Time: 09/19/2016, 9:30am  Type of Therapy/Topic:  Group Therapy:  Feelings about Diagnosis  Participation Level:  Active   Mood: Reports good mood   Description of Group:    This group will allow patients to explore their thoughts and feelings about diagnoses they have received. Patients will be guided to explore their level of understanding and acceptance of these diagnoses. Facilitator will encourage patients to process their thoughts and feelings about the reactions of others to their diagnosis, and will guide patients in identifying ways to discuss their diagnosis with significant others in their lives. This group will be process-oriented, with patients participating in exploration of their own experiences as well as giving and receiving support and challenge from other group members.   Therapeutic Goals: 1. Patient will demonstrate understanding of diagnosis as evidence by identifying two or more symptoms of the disorder:  2. Patient will be able to express two feelings regarding the diagnosis 3. Patient will demonstrate ability to communicate their needs through discussion and/or role plays  Summary of Patient Progress: Pt able to meet above therapeutic goals, however still very vague and not very forthcoming about feelings that have led up to hospitalization.  Pleasant and cooperative.   Therapeutic Modalities:   Cognitive Behavioral Therapy Brief Therapy Feelings Identification    Jake SharkSara Mataya Kilduff, MSW, LCSW

## 2016-09-20 NOTE — Plan of Care (Signed)
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

## 2016-09-20 NOTE — Progress Notes (Signed)
D: Patient stated slept good last night .Stated appetite is good and energy level  Is normal. Stated concentration is good . Stated on Depression scale 0 , hopeless 0 and anxiety 0 .( low 0-10 high) Denies suicidal  homicidal ideations  .  No auditory hallucinations  No pain concerns . Appropriate ADL'S. Interacting with peers and staff. Working on Pharmacologistcoping skills  A: Encourage patient participation with unit programming . Instruction  Given on  Medication , verbalize understanding. R: Voice no other concerns. Staff continue to monitor

## 2016-09-20 NOTE — Tx Team (Signed)
Interdisciplinary Treatment and Diagnostic Plan Update  09/20/2016 Time of Session: 10:30 AM Brent Peterson [name]  Principal Diagnosis: MDD (major depressive disorder), single episode, moderate (HCC)  Secondary Diagnoses: Principal Problem:   MDD (major depressive disorder), single episode, moderate (HCC) Active Problems:   Suicide attempt   Overdose of nonsteroidal anti-inflammatory drug (NSAID)   Current Medications:  Current Facility-Administered Medications  Medication Dose Route Frequency Provider Last Rate Last Dose  . acetaminophen (TYLENOL) tablet 650 mg  650 mg Oral Q6H PRN Audery AmelJohn T Clapacs, MD      . alum & mag hydroxide-simeth (MAALOX/MYLANTA) 200-200-20 MG/5ML suspension 30 mL  30 mL Oral Q4H PRN Audery AmelJohn T Clapacs, MD      . citalopram (CELEXA) tablet 20 mg  20 mg Oral Daily [name], MD   20 mg at 09/20/16 [id number]  . magnesium hydroxide (MILK OF MAGNESIA) suspension 30 mL  30 mL Oral Daily PRN Audery AmelJohn T Clapacs, MD      . QUEtiapine (SEROQUEL) tablet 200 mg  200 mg Oral QHS [name], MD   200 mg at 09/19/16 2138   PTA Medications: Prescriptions Prior to Admission  Medication Sig Dispense Refill Last Dose  . citalopram (CELEXA) 20 MG tablet Take 1 tablet (20 mg total) by mouth daily. 30 tablet 0     Patient Stressors: Marital or family conflict  Patient Strengths: Manufacturing systems engineerCommunication skills Physical Health Special hobby/interest Supportive family/friends  Treatment Modalities: Medication Management, Group therapy, Case management,  1 to 1 session with clinician, Psychoeducation, Recreational therapy.   Physician Treatment Plan for Primary Diagnosis: MDD (major depressive disorder), single episode, moderate (HCC) Long Term Goal(s): Improvement in symptoms so as ready for discharge   Short Term Goals: Ability to identify changes in lifestyle to reduce recurrence of condition will improve Ability to verbalize feelings will  improve Ability to disclose and discuss suicidal ideas Ability to demonstrate self-control will improve Ability to identify and develop effective coping behaviors will improve Ability to maintain clinical measurements within normal limits will improve Compliance with prescribed medications will improve  Medication Management: Evaluate patient's response, side effects, and tolerance of medication regimen.  Therapeutic Interventions: 1 to 1 sessions, Unit Group sessions and Medication administration.  Evaluation of Outcomes: Progressing  Physician Treatment Plan for Secondary Diagnosis: Principal Problem:   MDD (major depressive disorder), single episode, moderate (HCC) Active Problems:   Suicide attempt   Overdose of nonsteroidal anti-inflammatory drug (NSAID)  Long Term Goal(s): Improvement in symptoms so as ready for discharge   Short Term Goals: Ability to identify changes in lifestyle to reduce recurrence of condition will improve Ability to verbalize feelings will improve Ability to disclose and discuss suicidal ideas Ability to demonstrate self-control will improve Ability to identify and develop effective coping behaviors will improve Ability to maintain clinical measurements within normal limits will improve Compliance with prescribed medications will improve     Medication Management: Evaluate patient's response, side effects, and tolerance of medication regimen.  Therapeutic Interventions: 1 to 1 sessions, Unit Group sessions and Medication administration.  Evaluation of Outcomes: Progressing   RN Treatment Plan for Primary Diagnosis: MDD (major depressive disorder), single episode, moderate (HCC) Long Term Goal(s): Knowledge of disease and therapeutic regimen to maintain health will improve  Short Term Goals: Ability to remain free from injury will improve, Ability to verbalize frustration and anger appropriately will improve, Ability to verbalize feelings will  improve and Ability to disclose and discuss suicidal ideas  Medication Management: RN will  administer medications as ordered by provider, will assess and evaluate patient's response and provide education to patient for prescribed medication. RN will report any adverse and/or side effects to prescribing provider.  Therapeutic Interventions: 1 on 1 counseling sessions, Psychoeducation, Medication administration, Evaluate responses to treatment, Monitor vital signs and CBGs as ordered, Perform/monitor CIWA, COWS, AIMS and Fall Risk screenings as ordered, Perform wound care treatments as ordered.  Evaluation of Outcomes: Progressing   LCSW Treatment Plan for Primary Diagnosis: MDD (major depressive disorder), single episode, moderate (HCC) Long Term Goal(s): Safe transition to appropriate next level of care at discharge, Engage patient in therapeutic group addressing interpersonal concerns.  Short Term Goals: Engage patient in aftercare planning with referrals and resources, Increase social support, Increase emotional regulation and Facilitate acceptance of mental health diagnosis and concerns  Therapeutic Interventions: Assess for all discharge needs, 1 to 1 time with Social worker, Explore available resources and support systems, Assess for adequacy in community support network, Educate family and significant other(s) on suicide prevention, Complete Psychosocial Assessment, Interpersonal group therapy.  Evaluation of Outcomes: Progressing   Progress in Treatment: Attending groups: Yes. Participating in groups: Yes. Taking medication as prescribed: Yes. Toleration medication: Yes. Family/Significant other contact made: Yes, individual(s) contacted:  Girlfriend Patient understands diagnosis: Yes. Discussing patient identified problems/goals with staff: Yes. Medical problems stabilized or resolved: Yes. Denies suicidal/homicidal ideation: Yes. Issues/concerns per patient self-inventory:  No.   New problem(s) identified: No, Describe:  NONE.  New Short Term/Long Term Goal(s):  Discharge Plan or Barriers:   Reason for Continuation of Hospitalization: Depression Suicidal ideation  Estimated Length of Stay: D/C Friday 09/22/2016  Attendees: Patient: Brent Peterson 09/20/2016 10:38 AM  Physician: Dr. Radene JourneyAndrea Hernandez, MD 09/20/2016 10:38 AM  Nursing: Leonia ReaderPhyllis Cobb 09/20/2016 10:38 AM  RN Care Manager: 09/20/2016 10:38 AM  Social Worker: Hampton AbbotKadijah Caty Tessler, MSW, LCSW-A 09/20/2016 10:38 AM  Recreational Therapist: Hershal CoriaBeth Greene, LRT, CTRS  09/20/2016 10:38 AM    Scribe for Treatment Team: Lynden OxfordKadijah R Jasmin Trumbull, LCSWA 09/20/2016 10:38 AM
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
## 2016-09-20 NOTE — [facility] Group Notes (Signed)
[facility] LCSW Group Therapy  09/20/2016 10:51 AM  Type of Therapy:  Group Therapy  Participation Level:  Active  Participation Quality:  Appropriate and Sharing  Affect:  Appropriate  Cognitive:  Alert  Insight:  Improving  Engagement in Therapy:  Improving  Modes of Intervention:  Activity, Discussion, Education and Support  Summary of Progress/Problems:Emotional Regulation: Patients will identify both negative and positive emotions. They will discuss emotions they have difficulty regulating and how they impact their lives. Patients will be asked to identify healthy coping skills to combat unhealthy reactions to negative emotions. Patient stated he would like to improve his communication and learn to be more assertive with his communication.     Karder Goodin G. Garnette CzechSampson MSW, LCSWA 09/20/2016, 10:54 AM

## 2016-09-20 NOTE — Progress Notes (Signed)
Recreation Therapy Notes  Date: 11.15.17 Time: 1:00 pm Location: Craft Room  Group Topic: Self-esteem  Goal Area(s) Addresses:  Patient will be able to identify benefit of self-esteem. Patient will be able to identify ways to increase self-esteem.  Behavioral Response: Attentive  Intervention: Self-Portrait  Activity: Patients were instructed to draw their self-portrait of how they were feeling. Patients were then instructed to write a positive trait about self and peers. Patients then drew a self-portrait of how they felt after reading positive traits from peers.  Education: LRT educated patients on ways they can increase their self-esteem.  Education Outcome: Acknowledges education/In group clarification offered   Clinical Observations/Feedback: Patient drew both self-portraits and wrote a positive trait about himself and his peers. Patient did not contribute to group discussion.  Jacquelynn CreeGreene,Nobie Alleyne M, LRT/CTRS 09/20/2016 3:50 PM

## 2016-09-20 NOTE — Plan of Care (Signed)
Problem: Medication: Goal: Compliance with prescribed medication regimen will improve Outcome: Progressing Pt taking medications as prescribed.  Problem: Coping: Goal: Ability to verbalize feelings will improve Outcome: Progressing Pt reports desire for d/c.

## 2016-09-20 NOTE — Progress Notes (Signed)
D: Pt is pleasant and cooperative this evening. He continues to report a desire for discharge. Denies SI/HI/AVH at this time. A: Emotional support and encouragement provided. Medications administered with education. q15 minute safety checks maintained. R: Pt remains free from harm. Will continue to monitor.

## 2016-09-21 MED ORDER — CITALOPRAM HYDROBROMIDE 20 MG PO TABS: 20.0000 mg | ORAL_TABLET | Freq: Every day | ORAL | 0 refills | Status: AC

## 2016-09-21 MED ORDER — QUETIAPINE FUMARATE 200 MG PO TABS: 200.0000 mg | ORAL_TABLET | Freq: Every day | ORAL | 0 refills | Status: AC

## 2016-09-21 NOTE — Plan of Care (Signed)
Problem: Safety: Goal: Ability to remain free from injury will improve Outcome: Progressing Pt has remained free from injury.  Problem: Health Behavior/Discharge Planning: Goal: Ability to manage health-related needs will improve Outcome: Progressing Pt is able to contribute to health-related needs.  Problem: Education: Goal: Ability to make informed decisions regarding treatment will improve Outcome: Progressing Pt will continue t make informed decisions regarding treatment.

## 2016-09-21 NOTE — Progress Notes (Signed)
Pt is alert and oriented x4, respirations even and unlabored, gait steady and unassisted, no acute distress noted. Denies having any pain or discomfort. Denies SI/HI/AVH, anxiety and depression. Pt appears to be very elated. Pt smiles and laughs with staff and peers stating "i'm feeling so good. There is no reason to be unhappy. I've got my family. Everything is great." No negative behavior noted. Is medication compliant. Remains on q15 minute observation checks for safety. Will continue to monitor.

## 2016-09-21 NOTE — Progress Notes (Signed)
Recreation Therapy Notes  Date: 11.16.17 Time: 1:00 pm Location: Craft Room  Group Topic: Leisure Education  Goal Area(s) Addresses:  Patient will ask peer questions related to leisure activities. Patient will verbalize benefit of using leisure as a Associate Professorcoping skill.  Behavioral Response: Attentive, Interactive  Intervention: Leisure Interview  Activity: Patients were paired up and given Leisure Interview worksheets. Patients were instructed to interview each other on leisure activities.  Education: LRT educated patient on what they need to participate in leisure.  Education Outcome: Acknowledges education/In group clarification offered  Clinical Observations/Feedback: Patient interviewed peer. Patient contributed to group discussion by stating that he is sometimes easily influenced by his peers, that he sometimes uses his free time wisely, why leisure is important, and what he needs to participate in leisure.  Jacquelynn CreeGreene,Raya Mckinstry M, LRT/CTRS 09/21/2016 4:05 PM

## 2016-09-21 NOTE — Plan of Care (Signed)
Problem: Coping: Goal: Ability to cope will improve Outcome: Progressing Continue to work on coping  skills   

## 2016-09-21 NOTE — Progress Notes (Signed)
D: Patient stated slept good last night .Stated appetite is good and energy level  Is normal. Stated concentration is good . Stated on Depression scale 0 , hopeless 0 and anxiety 0 .( low 0-10 high) Denies suicidal  homicidal ideations  .  No auditory hallucinations  No pain concerns . Appropriate ADL'S. Interacting with peers and staff. Stated he continue to work on  PharmacologistCoping skills . A: Encourage patient participation with unit programming . Instruction  Given on  Medication , verbalize understanding. R: Voice no other concerns. Staff continue to monitor

## 2016-09-21 NOTE — [facility] Group Notes (Signed)
[facility] LCSW Group Therapy  09/21/2016 2:23 PM  Type of Therapy:  Group Therapy  Participation Level:  Active  Participation Quality:  Appropriate and Sharing  Affect:  Appropriate  Cognitive:  Appropriate  Insight:  Improving  Engagement in Therapy:  Engaged  Modes of Intervention:  Activity, Discussion, Education and Support  Summary of Progress/Problems:Balance in life: Patients will discuss the concept of balance and how it looks and feels to be unbalanced. Pt will identify areas in their life that is unbalanced and ways to become more balanced. Patient talked about finding more time to relax and spending time with family.    Natanya Holecek G. Garnette CzechSampson MSW, LCSWA 09/21/2016, 2:25 PM

## 2016-09-21 NOTE — [facility] Group Notes (Signed)
[facility] Group Notes:  (Nursing/MHT/Case Management/Adjunct)  Date:  09/21/2016  Time:  3:53 AM  Type of Therapy:  Psychoeducational Skills  Participation Level:  Active  Participation Quality:  Appropriate and Sharing  Affect:  Appropriate  Cognitive:  Appropriate  Insight:  Appropriate and Good  Engagement in Group:  Engaged  Modes of Intervention:  Discussion, Socialization and Support  Summary of Progress/Problems:  Brent MilroyLaquanda Y Kahner Peterson 09/21/2016, 3:53 AM

## 2016-09-21 NOTE — [facility] Suicide Risk Assessment (Signed)
[facility]   Principal Problem: MDD (major depressive disorder), single episode, moderate (HCC) Discharge Diagnoses:  Patient Active Problem List   Diagnosis Date Noted  . MDD (major depressive disorder), single episode, moderate (HCC) [F32.1] 09/18/2016  . Overdose of nonsteroidal anti-inflammatory drug (NSAID) [T39.391A] 09/15/2016  . Suicide attempt [T14.91XA] 07/22/2016      Psychiatric Specialty Exam: ROS  Blood pressure 105/62, pulse 69, temperature 98.7 F (37.1 C), temperature source Oral, resp. rate 18, height 5\' 7"  (1.702 m), weight 57.6 kg (127 lb), SpO2 99 %.Body mass index is 19.89 kg/m.                                                       Mental Status Per Nursing Assessment::   On Admission:  NA  Demographic Factors:  Male and Adolescent or young adult  Loss Factors: Loss of significant relationship  Historical Factors: Prior suicide attempts and Impulsivity  Risk Reduction Factors:   Responsible for children under 23 years of age, Sense of responsibility to family, Religious beliefs about death, Employed, Living with another person, especially a relative and Positive social support patient denies having any access to guns  Continued Clinical Symptoms:  Depression:   Impulsivity Previous Psychiatric Diagnoses and Treatments  Cognitive Features That Contribute To Risk:  Polarized thinking    Suicide Risk:  Minimal: No identifiable suicidal ideation.  Patients presenting with no risk factors but with morbid ruminations; may be classified as minimal risk based on the severity of the depressive symptoms  Follow-up Information    [facility]. Go on 09/25/2016.   Why:  [facility] for medication management and therapy. Arrive as early as possible for prompt service. Please call Unk PintoHarvey Bryant at [phone] for questions and assistance. Contact  information: [facility] [place] [address] [place] [address] Ph: [phone] Fax: [phone]           Jimmy FootmanHernandez-Gonzalez,  Kema Santaella, MD 09/21/2016, 1:00 PM
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
## 2016-09-22 NOTE — Progress Notes (Signed)
Provided and reviewed discharge paperwork and prescriptions. Also provided 7 day supply of medications from pharmacy to take home. Verified understanding by use of teach back method. Verbalizes understanding as well. Once discharged, will return pt belongings from locker to include: one black belt, 2 black shoe strings. Pt denies SI/HI/AHV at this time, verbalized understanding of resources to be used on discharge if he does have suicidal thoughts/feelings. Pt's girlfriend to pickup and transport to their home. Safety maintained. Will continue to monitor.

## 2016-09-22 NOTE — Progress Notes (Signed)
D: Observed pt on phone. Patient alert and oriented x4. Patient denies SI/HI/AVH. Pt affect is appropriate. Pt interacting well with peers and staff. Pt active on the unit. Pt indicated that he is do for discharge tomorrow and indicated he is "excited." Pt stated his time here "allowed me to cope with difficulty situations." Pt had no complaints. A: Offered active listening and support. Provided therapeutic communication. Administered scheduled medications. Educated pt on importance of maintaining medication regimen post-discharge.  R: Pt pleasant and cooperative. Pt acknowledged importance of maintaining medications.  Pt medication compliant. Will continue Q15 min. checks. Safety maintained.

## 2016-09-22 NOTE — Tx Team (Signed)
Interdisciplinary Treatment and Diagnostic Plan Update  09/22/2016 Time of Session: 11:40am Brent EllisDaymond R Isaac MRN: [id number]  Principal Diagnosis: MDD (major depressive disorder), single episode, moderate (HCC)  Secondary Diagnoses: Principal Problem:   MDD (major depressive disorder), single episode, moderate (HCC) Active Problems:   Suicide attempt   Overdose of nonsteroidal anti-inflammatory drug (NSAID)   Current Medications:  Current Facility-Administered Medications  Medication Dose Route Frequency Provider Last Rate Last Dose  . acetaminophen (TYLENOL) tablet 650 mg  650 mg Oral Q6H PRN Audery AmelJohn T Clapacs, MD      . alum & mag hydroxide-simeth (MAALOX/MYLANTA) 200-200-20 MG/5ML suspension 30 mL  30 mL Oral Q4H PRN Audery AmelJohn T Clapacs, MD      . citalopram (CELEXA) tablet 20 mg  20 mg Oral Daily [name], MD   20 mg at 09/22/16 0849  . magnesium hydroxide (MILK OF MAGNESIA) suspension 30 mL  30 mL Oral Daily PRN Audery AmelJohn T Clapacs, MD      . QUEtiapine (SEROQUEL) tablet 200 mg  200 mg Oral QHS [name], MD   200 mg at 09/21/16 2252   PTA Medications: Prescriptions Prior to Admission  Medication Sig Dispense Refill Last Dose  . citalopram (CELEXA) 20 MG tablet Take 1 tablet (20 mg total) by mouth daily. 30 tablet 0     Patient Stressors: Marital or family conflict  Patient Strengths: Manufacturing systems engineerCommunication skills Physical Health Special hobby/interest Supportive family/friends  Treatment Modalities: Medication Management, Group therapy, Case management,  1 to 1 session with clinician, Psychoeducation, Recreational therapy.   Physician Treatment Plan for Primary Diagnosis: MDD (major depressive disorder), single episode, moderate (HCC) Long Term Goal(s): Improvement in symptoms so as ready for discharge   Short Term Goals: Ability to identify changes in lifestyle to reduce recurrence of condition will improve Ability to verbalize feelings will  improve Ability to disclose and discuss suicidal ideas Ability to demonstrate self-control will improve Ability to identify and develop effective coping behaviors will improve Ability to maintain clinical measurements within normal limits will improve Compliance with prescribed medications will improve  Medication Management: Evaluate patient's response, side effects, and tolerance of medication regimen.  Therapeutic Interventions: 1 to 1 sessions, Unit Group sessions and Medication administration.  Evaluation of Outcomes: Adequate for Discharge  Physician Treatment Plan for Secondary Diagnosis: Principal Problem:   MDD (major depressive disorder), single episode, moderate (HCC) Active Problems:   Suicide attempt   Overdose of nonsteroidal anti-inflammatory drug (NSAID)  Long Term Goal(s): Improvement in symptoms so as ready for discharge   Short Term Goals: Ability to identify changes in lifestyle to reduce recurrence of condition will improve Ability to verbalize feelings will improve Ability to disclose and discuss suicidal ideas Ability to demonstrate self-control will improve Ability to identify and develop effective coping behaviors will improve Ability to maintain clinical measurements within normal limits will improve Compliance with prescribed medications will improve     Medication Management: Evaluate patient's response, side effects, and tolerance of medication regimen.  Therapeutic Interventions: 1 to 1 sessions, Unit Group sessions and Medication administration.  Evaluation of Outcomes: Adequate for Discharge   RN Treatment Plan for Primary Diagnosis: MDD (major depressive disorder), single episode, moderate (HCC) Long Term Goal(s): Knowledge of disease and therapeutic regimen to maintain health will improve  Short Term Goals: Ability to remain free from injury will improve, Ability to verbalize frustration and anger appropriately will improve, Ability to  verbalize feelings will improve and Ability to disclose and discuss suicidal ideas  Medication  Management: RN will administer medications as ordered by provider, will assess and evaluate patient's response and provide education to patient for prescribed medication. RN will report any adverse and/or side effects to prescribing provider.  Therapeutic Interventions: 1 on 1 counseling sessions, Psychoeducation, Medication administration, Evaluate responses to treatment, Monitor vital signs and CBGs as ordered, Perform/monitor CIWA, COWS, AIMS and Fall Risk screenings as ordered, Perform wound care treatments as ordered.  Evaluation of Outcomes: Adequate forDischarge   LCSW Treatment Plan for Primary Diagnosis: MDD (major depressive disorder), single episode, moderate (HCC) Long Term Goal(s): Safe transition to appropriate next level of care at discharge, Engage patient in therapeutic group addressing interpersonal concerns.  Short Term Goals: Engage patient in aftercare planning with referrals and resources, Increase social support, Increase emotional regulation and Facilitate acceptance of mental health diagnosis and concerns  Therapeutic Interventions: Assess for all discharge needs, 1 to 1 time with Social worker, Explore available resources and support systems, Assess for adequacy in community support network, Educate family and significant other(s) on suicide prevention, Complete Psychosocial Assessment, Interpersonal group therapy.  Evaluation of Outcomes: Adequate for Discharge   Progress in Treatment: Attending groups: Yes. Participating in groups: Yes. Taking medication as prescribed: Yes. Toleration medication: Yes. Family/Significant other contact made: Yes, individual(s) contacted: girlfriend Patient understands diagnosis: Yes. Discussing patient identified problems/goals with staff: Yes. Medical problems stabilized or resolved: Yes. Denies suicidal/homicidal ideation:  Yes. Issues/concerns per patient self-inventory: No. Other: n/a  New problem(s) identified: None identified at this time.   New Short Term/Long Term Goal(s): None identified at this time.   Discharge Plan or Barriers: Patient will follow-up with [facility] for outpatient services.  Reason for Continuation of Hospitalization: Anticipated discharge for 09/22/2016.  Estimated Length of Stay: Anticipated discharge for 09/22/2016  Attendees: Patient: Brent EllisDaymond R Naji 09/22/2016 11:39 AM  Physician: Dr. Radene JourneyAndrea HernandezJayme Cloud- Gonzalez, MD 09/22/2016 11:39 AM  Nursing: Shelia MediaJanet Jones, RN 09/22/2016 11:39 AM  RN Care Manager: 09/22/2016 11:39 AM  Social Worker: Fredrich BirksAmaris G. Garnette CzechSampson MSW, LCSWA 09/22/2016 11:39 AM  Recreational Therapist: Jacquelynn CreeElizabeth M. Greene, LRT/CTRS 09/22/2016 11:39 AM  Other:  09/22/2016 11:39 AM  Other:  09/22/2016 11:39 AM  Other: 09/22/2016 11:39 AM    Scribe for Treatment Team: Arelia LongestAmaris G Twana Wileman, LCSWA 09/22/2016 12:03 PM
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
## 2016-09-22 NOTE — Plan of Care (Signed)
Problem: Medication: Goal: Compliance with prescribed medication regimen will improve Outcome: Progressing Pt compliant and knowledgeable in regards to medication regimen.

## 2016-09-22 NOTE — Progress Notes (Signed)
September 22, 2016    Patient:  Brent EllisDaymond R Peterson is an 25 y.o., male MRN:  [id number] DOB:  10/01/1991 Patient phone:  [phone] (home)          Patient address:   [address] [address] [place] [address],      To whom it may concern:  This patient was hospitalized at [facility] from 09/15/16 to 09/22/16. This patient has been medically cleared and will be able to return to school/work on 09/25/16.   If more information is needed about this case, please do not hesitate to contact me at [phone].  Sincerely,    Radene JourneyAndrea Hernandez MD [facility] [address], [place], [place] [address]

## 2016-09-22 NOTE — Progress Notes (Signed)
Pt awake and alert, up on unit this morning. Interacts appropriately with peers/staff. Noted to be socializing in dayroom. Reports good sleep last night, good appetite, normal energy, good concentration. Rates depression 0/10, anxiety 0/10, hopelessness 0/10 (low 0-10 high). Denies SI/HI/AVH. Reports goal today is "utilizing my coping mechanisms" by "go to group." Pt is calm, cooperative, pleasant, and is focused on discharging today by 1100 to be transported home by girlfriend.  Support and encouragement provided with use of therapeutic communication. Medications administered as ordered, 7 day supply submitted to pharmacy for fill. Safety maintained with every 15 minute checks. Will continue to monitor.

## 2016-09-23 NOTE — Progress Notes (Signed)
(  Late Entry from 09/22/2016)  [facility] :  Will you be returning to the same living situation after discharge:  Yes,  home to live with girlfriend At discharge, do you have transportation home?: Yes,  girlfriend Do you have the ability to pay for your medications: Yes,  patient has financial support from employment and girlfriend.   Release of information consent forms completed and in the chart;  Patient's signature needed at discharge.  Patient to Follow up at: Follow-up Information    [facility]. Go on 09/25/2016.   Why:  [facility] for medication management and therapy. Arrive as early as possible for prompt service. Please call Unk PintoHarvey Bryant at [phone] for questions and assistance. Contact information: [facility] [place] [address] [place] [address] Ph: [phone] Fax: [phone]          Next level of care provider has [facility] to [facility] Link:no  Safety Planning and Suicide Prevention discussed: Yes,  with patient and his girlfriend  Have you used any form of tobacco in the last 30 days? (Cigarettes, Smokeless Tobacco, Cigars, and/or Pipes): Yes  Has patient been referred to the Quitline?: Patient refused referral  Patient has been referred for addiction treatment: Yes, patient has follow-up with [facility] for outpatient services.  Hazelene Doten G. Garnette CzechSampson MSW, LCSWA 09/23/2016, 10:47 AM

## 2016-10-23 ENCOUNTER — Ambulatory Visit: Payer: Self-pay | Admitting: Pharmacy Technician

## 2016-10-23 NOTE — Progress Notes (Signed)
Patient scheduled for eligibility appointment at [facility].  Patient did not show for the appointment on 10/23/16 at 2:00p.m.  Patient did not reschedule eligibility appointment.  Sherilyn DacostaBetty J. Kluttz Care Manager [facility]

## 2017-01-05 ENCOUNTER — Emergency Department: Payer: Self-pay

## 2017-01-05 ENCOUNTER — Emergency Department
Admission: EM | Admit: 2017-01-05 | Discharge: 2017-01-05 | Disposition: A | Discharge status: Home / Self Care | Payer: Self-pay | Attending: Emergency Medicine | Admitting: Emergency Medicine

## 2017-01-05 ENCOUNTER — Encounter: Payer: Self-pay | Admitting: Emergency Medicine

## 2017-01-05 DIAGNOSIS — L03011 Cellulitis of right finger: Secondary | ICD-10-CM | POA: Insufficient documentation

## 2017-01-05 DIAGNOSIS — F17210 Nicotine dependence, cigarettes, uncomplicated: Secondary | ICD-10-CM | POA: Insufficient documentation

## 2017-01-05 DIAGNOSIS — Z23 Encounter for immunization: Secondary | ICD-10-CM | POA: Insufficient documentation

## 2017-01-05 LAB — CBC WITH DIFFERENTIAL/PLATELET
Basophils Absolute: 0.1 10*3/uL (ref 0–0.1)
Basophils Relative: 1 %
Eosinophils Absolute: 0 10*3/uL (ref 0–0.7)
Eosinophils Relative: 0 %
HCT: 44.8 % (ref 40.0–52.0)
Hemoglobin: 15.2 g/dL (ref 13.0–18.0)
Lymphocytes Relative: 17 %
Lymphs Abs: 1.9 10*3/uL (ref 1.0–3.6)
MCH: 30 pg (ref 26.0–34.0)
MCHC: 33.9 g/dL (ref 32.0–36.0)
MCV: 88.3 fL (ref 80.0–100.0)
Monocytes Absolute: 0.8 10*3/uL (ref 0.2–1.0)
Monocytes Relative: 7 %
Neutro Abs: 8.6 10*3/uL — ABNORMAL HIGH (ref 1.4–6.5)
Neutrophils Relative %: 75 %
Platelets: 187 10*3/uL (ref 150–440)
RBC: 5.07 MIL/uL (ref 4.40–5.90)
RDW: 12.9 % (ref 11.5–14.5)
WBC: 11.5 10*3/uL — ABNORMAL HIGH (ref 3.8–10.6)

## 2017-01-05 LAB — COMPREHENSIVE METABOLIC PANEL
ALT: 18 U/L (ref 17–63)
AST: 25 U/L (ref 15–41)
Albumin: 4.7 g/dL (ref 3.5–5.0)
Alkaline Phosphatase: 46 U/L (ref 38–126)
Anion gap: 9 (ref 5–15)
BUN: 8 mg/dL (ref 6–20)
CO2: 29 mmol/L (ref 22–32)
Calcium: 9.7 mg/dL (ref 8.9–10.3)
Chloride: 101 mmol/L (ref 101–111)
Creatinine, Ser: 1.12 mg/dL (ref 0.61–1.24)
GFR calc Af Amer: >60 mL/min (ref >60)
GFR calc non Af Amer: >60 mL/min (ref >60)
Glucose, Bld: 87 mg/dL (ref 65–99)
Potassium: 3.6 mmol/L (ref 3.5–5.1)
Sodium: 139 mmol/L (ref 135–145)
Total Bilirubin: 2.4 mg/dL — ABNORMAL HIGH (ref 0.3–1.2)
Total Protein: 7.8 g/dL (ref 6.5–8.1)

## 2017-01-05 MED ORDER — TETANUS-DIPHTH-ACELL PERTUSSIS 5-2.5-18.5 LF-MCG/0.5 IM SUSP
0.5000 mL | Freq: Once | INTRAMUSCULAR | Status: AC
  Administered 2017-01-05: 0.5 mL via INTRAMUSCULAR
  Filled 2017-01-05: qty 0.5

## 2017-01-05 MED ORDER — CLINDAMYCIN HCL 300 MG PO CAPS: 300.0000 mg | ORAL_CAPSULE | Freq: Three times a day (TID) | ORAL | 0 refills | Status: AC

## 2017-01-05 MED ORDER — CLINDAMYCIN HCL 150 MG PO CAPS
300.0000 mg | ORAL_CAPSULE | Freq: Once | ORAL | Status: AC
  Administered 2017-01-05: 300 mg via ORAL
  Filled 2017-01-05: qty 2

## 2017-01-05 MED ORDER — CLINDAMYCIN HCL 150 MG PO CAPS
ORAL_CAPSULE | ORAL | Status: AC
  Filled 2017-01-05: qty 1

## 2017-01-05 NOTE — ED Notes (Signed)
See triage note  Noticed pain with some swelling to right index finger about 2 days ago  But today noticed that finger is more red and swollen

## 2017-01-05 NOTE — ED Provider Notes (Signed)
[facility] Emergency Department Provider Note  ____________________________________________  Time seen: Approximately 4:54 PM  I have reviewed the triage vital signs and the nursing notes.   HISTORY  Chief Complaint Hand Pain   HPI Brent Peterson is a 26 y.o. male presenting to the emergency department with right index finger pain, edema and erythema that has occurred for the past 2 days. Patient has been afebrile. Patient states that he noticed a small cut of the skin overlying the PIP joint of the right index finger. 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 Patient cannot recall his last tetanus shot. He has tried hydrogen peroxide and Neosporin, which has not relieved his symptoms. Patient denies chest pain, chest tightness, shortness of breath, abdominal pain, nausea and vomiting. Patient denies homicidal or suicidal ideation at this time.   Past Medical History:  Diagnosis Date  . Bipolar disorder (HCC)   . Depression     Patient Active Problem List   Diagnosis Date Noted  . MDD (major depressive disorder), single episode, moderate (HCC) 09/18/2016  . Overdose of nonsteroidal anti-inflammatory drug (NSAID) 09/15/2016  . Suicide attempt 07/22/2016    Past Surgical History:  Procedure Laterality Date  . APPENDECTOMY     in 2004    Prior to Admission medications   Medication Sig Start Date End Date Taking? Authorizing Provider  citalopram (CELEXA) 20 MG tablet Take 1 tablet (20 mg total) by mouth daily. 09/22/16   [name], MD  clindamycin (CLEOCIN) 300 MG capsule Take 1 capsule (300 mg total) by mouth 3 (three) times daily. 01/05/17 01/15/17  Orvil FeilJaclyn M Jordin Dambrosio, PA-C  QUEtiapine (SEROQUEL) 200 MG tablet Take 1 tablet (200 mg total) by mouth at bedtime. 09/21/16   [name], MD    Allergies Patient has no known allergies.  History reviewed. No pertinent family  history.  Social History Social History  Substance Use Topics  . Smoking status: Current Every Day Smoker    Packs/day: 0.25    Types: Cigarettes  . Smokeless tobacco: Never Used  . Alcohol use 0.6 oz/week    1 Cans of beer per week     Comment: 1 beer per day    Review of Systems  Constitutional: No fever/chills Eyes: No visual changes. No discharge ENT: No upper respiratory complaints. Cardiovascular: no chest pain. Respiratory: no cough. No SOB. Gastrointestinal: No abdominal pain.  No nausea, no vomiting.  No diarrhea.  No constipation. Musculoskeletal: Patient has right index finger pain.  Skin: Patient has erythema and edema of the right index finger.  Neurological: Negative for headaches, focal weakness or numbness.  ____________________________________________   PHYSICAL EXAM:  VITAL SIGNS: ED Triage Vitals  Enc Vitals Group     BP 01/05/17 1611 136/80     Pulse Rate 01/05/17 1611 98     Resp 01/05/17 1611 16     Temp 01/05/17 1611 98.2 F (36.8 C)     Temp Source 01/05/17 1611 Oral     SpO2 01/05/17 1611 100 %     Weight 01/05/17 1612 140 lb (63.5 kg)     Height 01/05/17 1612 5\' 7"  (1.702 m)     Head Circumference --      Peak Flow --      Pain Score 01/05/17 1612 1     Pain Loc --      Pain Edu? --      Excl. in [place]? --  Constitutional: Alert and oriented. Well appearing and in no acute distress. Hematological/Lymphatic/Immunilogical: No cervical lymphadenopathy. Cardiovascular: Normal rate, regular rhythm. Normal S1 and S2.  Good peripheral circulation. Respiratory: Normal respiratory effort without tachypnea or retractions. Lungs CTAB. Good air entry to the bases with no decreased or absent breath sounds. Gastrointestinal: Bowel sounds 4 quadrants. Soft and nontender to palpation. No guarding or rigidity. No palpable masses. No distention. No CVA tenderness. Musculoskeletal: Patient has 5 out of 5 strength in the upper extremities bilaterally.  Patient has full range of motion at the shoulder, elbow and wrist bilaterally. To inspection, right index finger is erythematous and edematous. Patient is able to perform flexion and extension at the PIP and DIP joint of the right index finger. Palpable radial and ulnar pulses bilaterally and symmetrically. Neurologic:  Normal speech and language. No gross focal neurologic deficits are appreciated. Reflexes are 2+ and symmetric in the upper extremities bilaterally. Skin:  Patient has blanching erythema and edema of the right index finger. Psychiatric: Mood and affect are normal. Speech and behavior are normal. Patient exhibits appropriate insight and judgement.   ____________________________________________   LABS (all labs ordered are listed, but only abnormal results are displayed)  Labs Reviewed  CBC WITH DIFFERENTIAL/PLATELET - Abnormal; Notable for the following:       Result Value   WBC 11.5 (*)    Neutro Abs 8.6 (*)    All other components within normal limits  COMPREHENSIVE METABOLIC PANEL - Abnormal; Notable for the following:    Total Bilirubin 2.4 (*)    All other components within normal limits   ____________________________________________  EKG   ____________________________________________  RADIOLOGY Geraldo Pitter, personally viewed and evaluated these images (plain radiographs) as part of my medical decision making, as well as reviewing the written report by the radiologist.   Dg Hand Complete Right  Result Date: 01/05/2017 CLINICAL DATA:  Initial encounter for Pt has swelling and redness to right index finger. Sx started couple days ago. No fevers. Unsure what from. 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 EXAM: RIGHT HAND - COMPLETE 3+ VIEW COMPARISON:  None. FINDINGS: Soft tissue swelling is identified about the proximal phalanx of the second digit. No radiopaque foreign object. No acute fracture or dislocation. No osseous destruction. IMPRESSION: Soft tissue  swelling, without acute osseous abnormality. Electronically Signed   By: Jeronimo Greaves M.D.   On: 01/05/2017 17:34    ____________________________________________    PROCEDURES  Procedure(s) performed:    Procedures    Medications  clindamycin (CLEOCIN) 150 MG capsule (not administered)  Tdap (BOOSTRIX) injection 0.5 mL (0.5 mLs Intramuscular Given 01/05/17 1717)  clindamycin (CLEOCIN) capsule 300 mg (300 mg Oral Given 01/05/17 1756)     ____________________________________________   INITIAL IMPRESSION / ASSESSMENT AND PLAN / ED COURSE  Pertinent labs & imaging results that were available during my care of the patient were reviewed by me and considered in my medical decision making (see chart for details).  Review of the  [place] was performed in accordance of the [facility] prior to dispensing any controlled drugs.    Assessment and Plan: Cellulitis:  Patient presents to the emergency department with blancing erythema and edema of the right index finger. Physical exam findings are consistent with cellulitis. DG right hand reveals soft tissue swelling but no acute fractures or retained foreign bodies. CBC and BMP are reassuring at this time. Patient was given clindamycin in the emergency department. Patient was given tetanus shot in the emergency  department. Patient was discharged with clindamycin. Vital signs are reassuring at this time. All patient questions were answered. ____________________________________________  FINAL CLINICAL IMPRESSION(S) / ED DIAGNOSES  Final diagnoses:  Cellulitis of finger of right hand      NEW MEDICATIONS STARTED DURING THIS VISIT:  Discharge Medication List as of 01/05/2017  5:43 PM    START taking these medications   Details  clindamycin (CLEOCIN) 300 MG capsule Take 1 capsule (300 mg total) by mouth 3 (three) times daily., Starting Fri 01/05/2017, Until Mon 01/15/2017, Print            This chart was dictated using voice recognition  software/Dragon. Despite best efforts to proofread, errors can occur which can change the meaning. Any change was purely unintentional.    Orvil Feil, PA-C 01/05/17 1813    Orvil Feil, PA-C 01/05/17 1817    Emily Filbert, MD 01/08/17 [phone]

## 2017-01-05 NOTE — ED Triage Notes (Signed)
Pt has swelling and redness to right index finger. Sx started couple days ago. No fevers. Unsure what from. 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

## 2017-09-27 IMAGING — DX DG HAND COMPLETE 3+V*R*
3 series · 3 of 3 positions shown · non-contrast
Comparison: None.

CLINICAL DATA: Initial encounter for Pt has swelling and redness to
right index finger. Sx started couple days ago. No fevers. Unsure
what from. Thinks may be from nail in warehouse.

EXAM:
RIGHT HAND - COMPLETE 3+ VIEW

[hand ap]
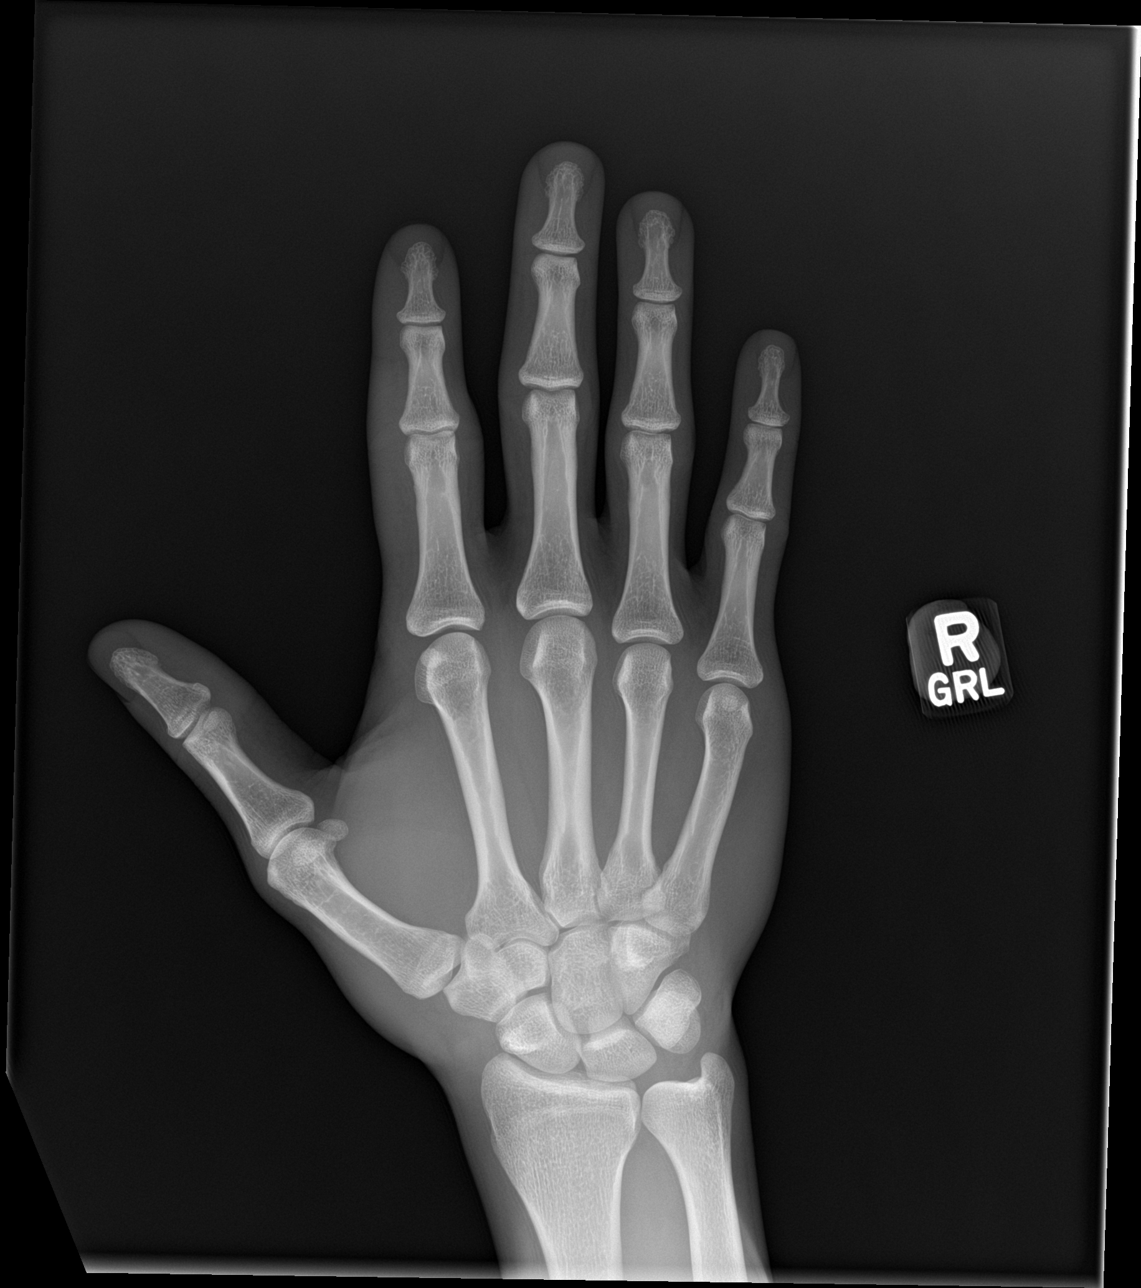

[hand obl]
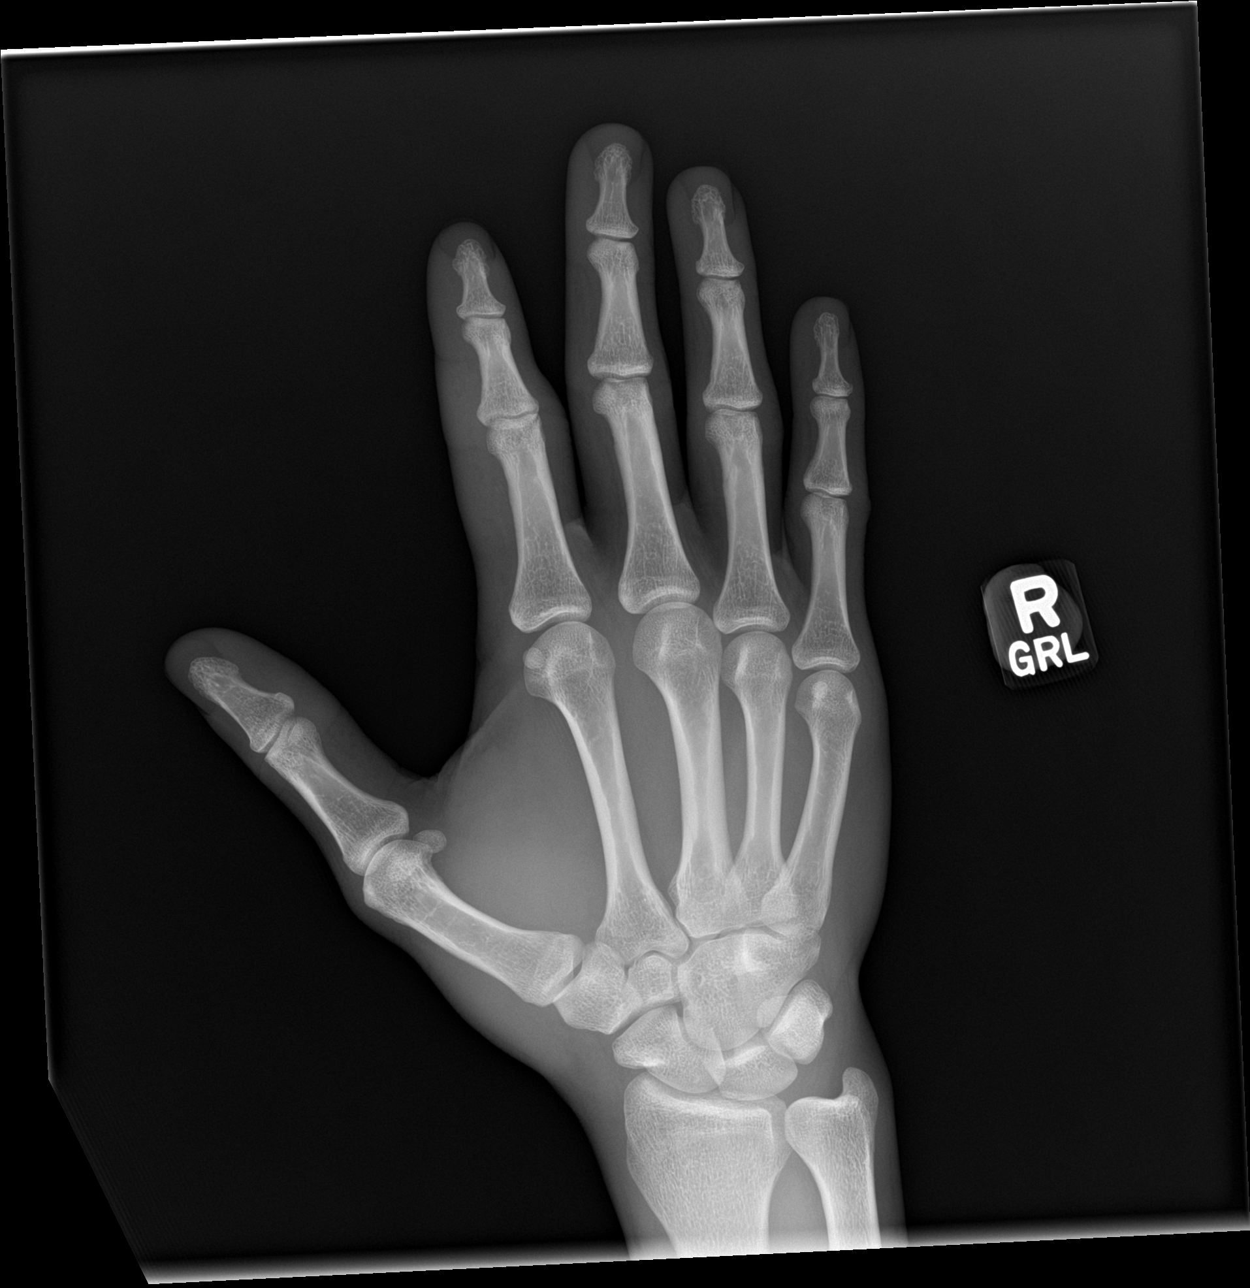

[hand lat]
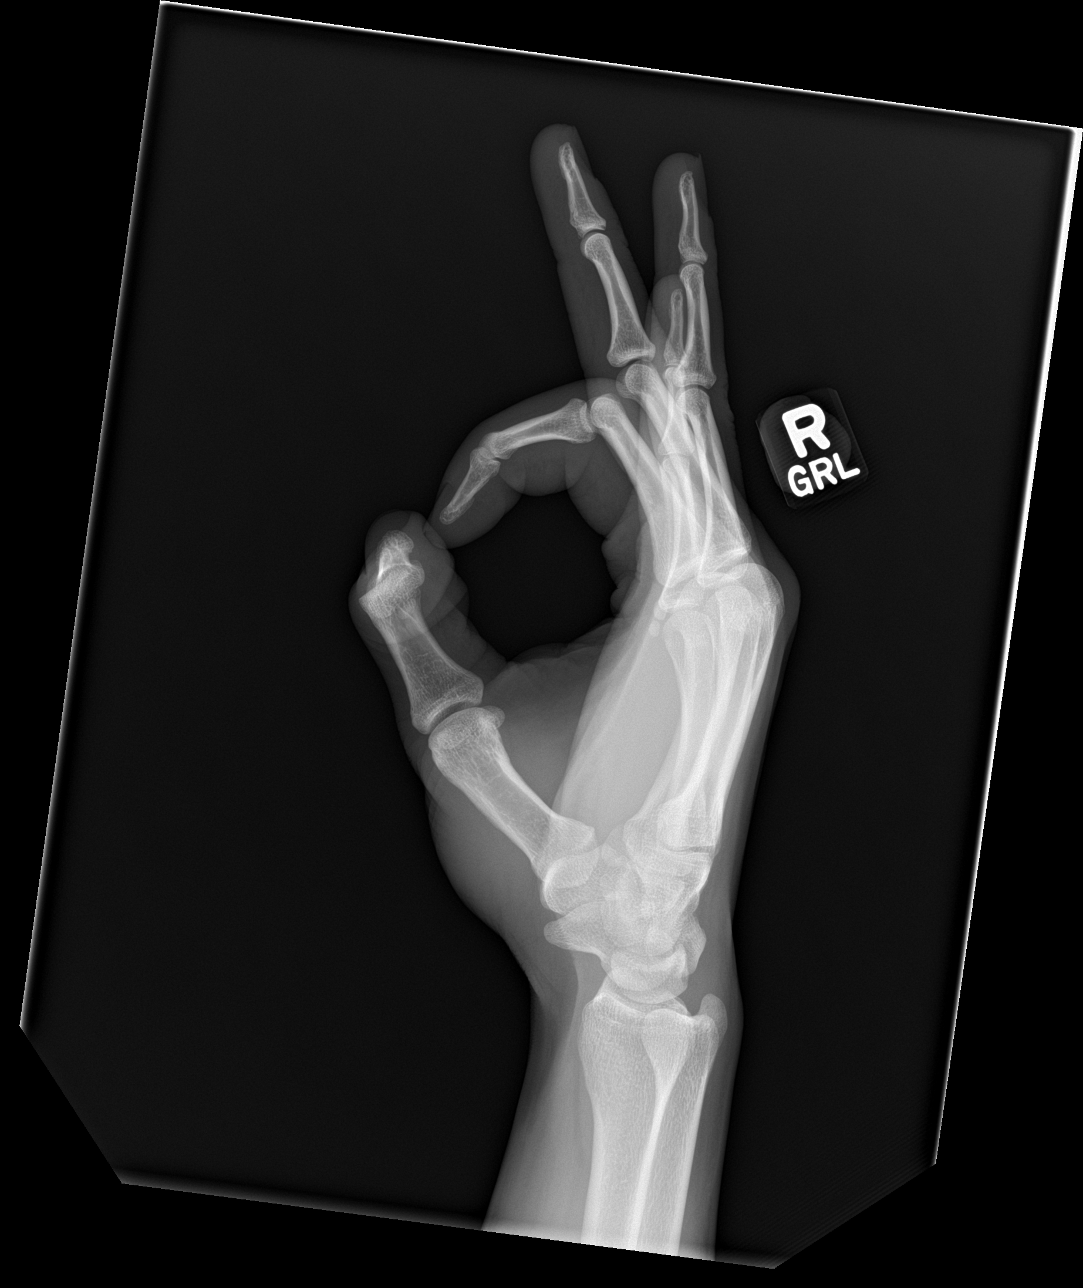

[3 of 3 positions shown; findings below may reference images not displayed]

FINDINGS: Soft tissue swelling is identified about the proximal phalanx of the
second digit. No radiopaque foreign object. No acute fracture or
dislocation. No osseous destruction.
IMPRESSION: Soft tissue swelling, without acute osseous abnormality.

## 2019-05-05 ENCOUNTER — Ambulatory Visit
Admission: EM | Admit: 2019-05-05 | Discharge: 2019-05-05 | Disposition: A | Discharge status: Home / Self Care | Payer: Worker's Compensation

## 2019-05-05 ENCOUNTER — Other Ambulatory Visit: Payer: Self-pay

## 2019-05-05 ENCOUNTER — Encounter: Payer: Self-pay | Admitting: Emergency Medicine

## 2019-05-05 DIAGNOSIS — S0990XA Unspecified injury of head, initial encounter: Secondary | ICD-10-CM

## 2019-05-05 DIAGNOSIS — Z042 Encounter for examination and observation following work accident: Secondary | ICD-10-CM

## 2019-05-05 DIAGNOSIS — W2209XA Striking against other stationary object, initial encounter: Secondary | ICD-10-CM | POA: Diagnosis not present

## 2019-05-05 DIAGNOSIS — S0001XA Abrasion of scalp, initial encounter: Secondary | ICD-10-CM

## 2019-05-05 NOTE — ED Notes (Signed)
Urine Drug Screen collected per protocol and placed in [place] for pick up.

## 2019-05-05 NOTE — ED Provider Notes (Signed)
[facility] ____________________________________________  Time seen: Approximately 1:11 PM  I have reviewed the triage vital signs and the nursing notes.   HISTORY  Chief Complaint Head Injury and Worker's Comp Injury   HPI Brent Peterson is a 28 y.o. male presenting for evaluation of head injury that occurred 2 hours prior to arrival while at work.  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  Patient reports that he bent down to get a pair shoes but states once he was up he quickly turned to his left and hit the left side of his head on the side of a palate of wood that was next to him.  Patient states when he hit his head it scared him causing him to fall backwards to sitting position.  States he did not hit his head when he fell.  States he remembers the and bent completely without any gap of memory.  No loss of consciousness.  States he does have some light sensitivity to his vision with mild headache to the left side of his head only.  Denies any other headache.  Denies vision loss.  Denies nausea, vomiting, dizziness, unsteady gait.  Denies recent sickness, cough, congestion, fevers or other complaints.  Reports otherwise doing well.  States currently feels better than he did initially.  No alleviating measures attempted.  Denies aggravating factors.  Reports tetanus immunization is up-to-date.   Past Medical History:  Diagnosis Date  . Bipolar disorder (HCC)   . Depression     Patient Active Problem List   Diagnosis Date Noted  . MDD (major depressive disorder), single episode, moderate (HCC) 09/18/2016  . Overdose of nonsteroidal anti-inflammatory drug (NSAID) 09/15/2016  . Suicide attempt (HCC) 07/22/2016    Past Surgical History:  Procedure Laterality Date  . APPENDECTOMY     in 2004     No current facility-administered medications for this encounter.   Current Outpatient Medications:  .  citalopram (CELEXA) 20 MG tablet, Take 1 tablet (20 mg total) by  mouth daily., Disp: 30 tablet, Rfl: 0 .  QUEtiapine (SEROQUEL) 200 MG tablet, Take 1 tablet (200 mg total) by mouth at bedtime., Disp: 30 tablet, Rfl: 0  Allergies Patient has no known allergies.  Family History  Problem Relation Age of Onset  . Healthy Mother   . Healthy Father     Social History Social History   Tobacco Use  . Smoking status: Current Every Day Smoker    Packs/day: 0.25    Types: Cigarettes  . Smokeless tobacco: Never Used  Substance Use Topics  . Alcohol use: Yes    Alcohol/week: 1.0 standard drinks    Types: 1 Cans of beer per week    Comment: 1 beer per day  . Drug use: No    Review of Systems Constitutional: No fever Eyes: Positive light sensitivity bilaterally.  No vision loss.  No other vision changes. ENT: No sore throat. Cardiovascular: Denies chest pain. Respiratory: Denies shortness of breath. Gastrointestinal: No abdominal pain.  No nausea, no vomiting. Genitourinary: Negative for dysuria. Musculoskeletal: Negative for back pain. Skin: Positive break in skin. Neurological: Negative for focal weakness or numbness.  Positive headache.    ____________________________________________   PHYSICAL EXAM:  VITAL SIGNS: ED Triage Vitals  Enc Vitals Group     BP 05/05/19 1222 100/87     Pulse Rate 05/05/19 1222 75     Resp 05/05/19 1222 16     Temp 05/05/19 1222 98.5 F (36.9 C)  Temp Source 05/05/19 1222 Oral     SpO2 05/05/19 1222 98 %     Weight 05/05/19 1219 150 lb (68 kg)     Height 05/05/19 1219 5\' 7"  (1.702 m)     Head Circumference --      Peak Flow --      Pain Score 05/05/19 1219 1     Pain Loc --      Pain Edu? --      Excl. in [place]? --     Constitutional: Alert and oriented. Well appearing and in no acute distress. Eyes: Conjunctivae are normal. PERRL. EOMI. no pain with EOMs. ENT      Head: Normocephalic.  Left anterior lateral scalp superficial abrasion noted, minimal tenderness to direct palpation, no active  bleeding, no edema.      Nose: No congestion      Mouth/Throat: Mucous membranes are moist. Cardiovascular: Normal rate, regular rhythm. Grossly normal heart sounds.  Good peripheral circulation. Respiratory: Normal respiratory effort without tachypnea nor retractions. Breath sounds are clear and equal bilaterally. No wheezes, rales, rhonchi. Musculoskeletal:   No midline cervical, thoracic or lumbar tenderness to palpation. Neurologic:  Normal speech and language. No gross focal neurologic deficits are appreciated. Speech is normal. No gait instability.  Negative pronator drift.  Negative Romberg.  Normal finger-to-nose.  No ataxia. 5/5 strength to bilateral upper and lower extremities. Skin:  Skin is warm, dry and intact. No rash noted. Psychiatric: Mood and affect are normal. Speech and behavior are normal. Patient exhibits appropriate insight and judgment   [place] CT head injury/trauma rule: No CT recommended. ___________________________________________   LABS (all labs ordered are listed, but only abnormal results are displayed)  Labs Reviewed - No data to display ____________________________________________   PROCEDURES Procedures    INITIAL IMPRESSION / ASSESSMENT AND PLAN / ED COURSE  Pertinent labs & imaging results that were available during my care of the patient were reviewed by me and considered in my medical decision making (see chart for details).  Well-appearing patient.  No acute distress.  Presenting for evaluation of head injury with abrasion to scalp that occurred at work this morning.  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  No focal neurological deficits.  Patient reports feeling better.  Wound no repair indicated.  Patient's tetanus immunization is up-to-date.  [place] CT head, no CT recommended.  Encourage rest, fluids, supportive care, Tylenol as needed.  No heavy lifting greater than 15 pounds for the next 3 days.  Recommend follow-up with occupational health in 3 days to  ensure patient better.  Discussed proceed directly to emergency room for any increased headache, dizziness, confusion, vomiting or worsening concerns.  Discussed follow up with Primary care physician this week. Discussed follow up and return parameters including no resolution or any worsening concerns. Patient verbalized understanding and agreed to plan.   ____________________________________________   FINAL CLINICAL IMPRESSION(S) / ED DIAGNOSES  Final diagnoses:  Minor head injury, initial encounter  Abrasion, scalp w/o infection     ED Discharge Orders    None       Note: This dictation was prepared with Dragon dictation along with smaller phrase technology. Any transcriptional errors that result from this process are unintentional.         Marylene Land, NP 05/05/19 1442

## 2019-05-05 NOTE — Discharge Instructions (Addendum)
Over the counter tylenol as needed. Apply ice.  Rest. Drink plenty of fluids. Avoid heavy lifting.   Follow up in 3 days with Milagros Evener for follow up. Call today to schedule.   Follow up with your primary care physician this week as needed. Return to Urgent care as needed.  Proceed directly to the emergency room for any increased headache, vision change, vomiting, confusion or worsening concerns.

## 2019-05-05 NOTE — ED Triage Notes (Signed)
Patient states that he hit the top of his head on a wooden pallet around 10:50 am this morning while at work  Patient c/o HA.  Patient denies N/V.
